# Patient Record
Sex: Female | Born: 1960 | Race: White | Hispanic: No | Marital: Married | Smoking: Never smoker
Health system: Southern US, Community
[De-identification: ages and names within clinical notes are randomized; demographics above are authoritative.]

## PROBLEM LIST (undated history)

## (undated) DIAGNOSIS — K219 Gastro-esophageal reflux disease without esophagitis: Secondary | ICD-10-CM

## (undated) DIAGNOSIS — F32A Depression, unspecified: Secondary | ICD-10-CM

## (undated) DIAGNOSIS — F329 Major depressive disorder, single episode, unspecified: Secondary | ICD-10-CM

## (undated) DIAGNOSIS — J9801 Acute bronchospasm: Secondary | ICD-10-CM

## (undated) DIAGNOSIS — F419 Anxiety disorder, unspecified: Secondary | ICD-10-CM

## (undated) DIAGNOSIS — I499 Cardiac arrhythmia, unspecified: Secondary | ICD-10-CM

## (undated) DIAGNOSIS — R062 Wheezing: Secondary | ICD-10-CM

## (undated) DIAGNOSIS — J189 Pneumonia, unspecified organism: Secondary | ICD-10-CM

## (undated) DIAGNOSIS — T148XXA Other injury of unspecified body region, initial encounter: Secondary | ICD-10-CM

## (undated) DIAGNOSIS — M792 Neuralgia and neuritis, unspecified: Secondary | ICD-10-CM

## (undated) HISTORY — DX: Anxiety disorder, unspecified: F41.9

## (undated) HISTORY — DX: Depression, unspecified: F32.A

## (undated) HISTORY — DX: Neuralgia and neuritis, unspecified: M79.2

## (undated) HISTORY — PX: ABDOMINAL HYSTERECTOMY: SHX81

## (undated) HISTORY — DX: Major depressive disorder, single episode, unspecified: F32.9

## (undated) HISTORY — DX: Acute bronchospasm: J98.01

## (undated) HISTORY — PX: TENDON REPAIR: SHX5111

## (undated) HISTORY — PX: TOTAL ABDOMINAL HYSTERECTOMY: SHX209

---

## 1988-08-23 HISTORY — PX: OTHER SURGICAL HISTORY: SHX169

## 2000-08-23 HISTORY — PX: EXCISIONAL HEMORRHOIDECTOMY: SHX1541

## 2005-08-23 HISTORY — PX: GANGLION CYST EXCISION: SHX1691

## 2013-08-23 HISTORY — PX: INGUINAL HERNIA REPAIR: SUR1180

## 2014-03-13 ENCOUNTER — Ambulatory Visit: Payer: Self-pay

## 2015-01-11 ENCOUNTER — Encounter: Payer: Self-pay | Admitting: Internal Medicine

## 2015-01-11 DIAGNOSIS — J3089 Other allergic rhinitis: Secondary | ICD-10-CM | POA: Insufficient documentation

## 2015-01-11 DIAGNOSIS — M792 Neuralgia and neuritis, unspecified: Secondary | ICD-10-CM | POA: Insufficient documentation

## 2015-01-11 DIAGNOSIS — F324 Major depressive disorder, single episode, in partial remission: Secondary | ICD-10-CM | POA: Insufficient documentation

## 2015-01-11 DIAGNOSIS — N951 Menopausal and female climacteric states: Secondary | ICD-10-CM | POA: Insufficient documentation

## 2015-01-11 HISTORY — DX: Neuralgia and neuritis, unspecified: M79.2

## 2015-05-16 ENCOUNTER — Other Ambulatory Visit: Payer: Self-pay | Admitting: Internal Medicine

## 2015-09-15 ENCOUNTER — Other Ambulatory Visit: Payer: Self-pay | Admitting: Internal Medicine

## 2015-11-04 ENCOUNTER — Ambulatory Visit (INDEPENDENT_AMBULATORY_CARE_PROVIDER_SITE_OTHER): Payer: BLUE CROSS/BLUE SHIELD | Admitting: Internal Medicine

## 2015-11-04 ENCOUNTER — Encounter: Payer: Self-pay | Admitting: Internal Medicine

## 2015-11-04 VITALS — BP 122/82 | HR 87 | Temp 98.4°F | Ht 67.0 in | Wt 202.4 lb

## 2015-11-04 DIAGNOSIS — J0100 Acute maxillary sinusitis, unspecified: Secondary | ICD-10-CM | POA: Diagnosis not present

## 2015-11-04 DIAGNOSIS — F324 Major depressive disorder, single episode, in partial remission: Secondary | ICD-10-CM

## 2015-11-04 MED ORDER — MOMETASONE FUROATE 50 MCG/ACT NA SUSP: 2.0000 | Freq: Every day | NASAL | Status: DC

## 2015-11-04 MED ORDER — LORAZEPAM 0.5 MG PO TABS: 0.5000 mg | ORAL_TABLET | Freq: Every day | ORAL | Status: DC

## 2015-11-04 MED ORDER — TIZANIDINE HCL 4 MG PO TABS: 4.0000 mg | ORAL_TABLET | Freq: Three times a day (TID) | ORAL | Status: DC

## 2015-11-04 MED ORDER — AMOXICILLIN-POT CLAVULANATE 875-125 MG PO TABS: 1.0000 | ORAL_TABLET | Freq: Two times a day (BID) | ORAL | Status: DC

## 2015-11-04 NOTE — Progress Notes (Signed)
Date:  11/04/2015   Name:  Casey Marquez   DOB:  [birth year]   MRN:  [id number]   Chief Complaint: Sinusitis Sinusitis This is a new problem. The current episode started 1 to 4 weeks ago. The problem has been gradually worsening since onset. There has been no fever. Associated symptoms include congestion, ear pain, headaches, sinus pressure and sneezing. Pertinent negatives include no chills, hoarse voice or shortness of breath. Past treatments include saline sprays and spray decongestants.     Review of Systems  Constitutional: Positive for fatigue. Negative for fever and chills.  HENT: Positive for congestion, ear pain, sinus pressure and sneezing. Negative for hoarse voice.   Respiratory: Negative for chest tightness, shortness of breath and wheezing.   Cardiovascular: Negative for chest pain and palpitations.  Gastrointestinal: Negative for vomiting, abdominal pain and diarrhea.  Neurological: Positive for headaches.    Patient Active Problem List   Diagnosis Date Noted  . Allergic state 01/11/2015  . Depression, major, single episode, in partial remission ([place]) 01/11/2015  . Hot flash, menopausal 01/11/2015  . Nerve pain 01/11/2015    Prior to Admission medications   Medication Sig Start Date End Date Taking? Authorizing Provider  FLUoxetine (PROZAC) 10 MG tablet TAKE 1 TABLET BY MOUTH EVERY DAY 09/15/15  Yes Glean Hess, MD  LORazepam (ATIVAN) 0.5 MG tablet Take 1 tablet by mouth at bedtime.   Yes Historical Provider, MD  tiZANidine (ZANAFLEX) 4 MG tablet TAKE 1 TABLET BY MOUTH 3 TIMES A DAY 05/16/15  Yes Glean Hess, MD    Allergies  Allergen Reactions  . Sertraline Nausea Only  . Tegretol  [Carbamazepine]     confusion    Past Surgical History  Procedure Laterality Date  . Inguinal hernia repair Left 2015  . Fibroidectomy  1990  . Ganglion cyst excision  2007  . Excisional hemorrhoidectomy  2002    Social History  Substance Use Topics  . Smoking  status: Never Smoker   . Smokeless tobacco: None  . Alcohol Use: 0.0 oz/week    0 Standard drinks or equivalent per week     Comment: occasionally    Medication list has been reviewed and updated.   Physical Exam  Constitutional: She is oriented to person, place, and time. She appears well-developed and well-nourished.  HENT:  Right Ear: External ear and ear canal normal. Tympanic membrane is retracted. Tympanic membrane is not erythematous.  Left Ear: External ear and ear canal normal. Tympanic membrane is not erythematous and not retracted.  Nose: Right sinus exhibits maxillary sinus tenderness and frontal sinus tenderness. Left sinus exhibits maxillary sinus tenderness and frontal sinus tenderness.  Mouth/Throat: Uvula is midline and mucous membranes are normal. No oral lesions. Posterior oropharyngeal erythema present. No oropharyngeal exudate.  Cardiovascular: Normal rate, regular rhythm and normal heart sounds.   Pulmonary/Chest: Breath sounds normal. She has no wheezes. She has no rales.  Musculoskeletal: She exhibits no edema or tenderness.  Lymphadenopathy:    She has no cervical adenopathy.  Neurological: She is alert and oriented to person, place, and time.    BP 122/82 mmHg  Pulse 87  Temp(Src) 98.4 F (36.9 C) (Oral)  Ht 5\' 7"  (1.702 m)  Wt 202 lb 6.4 oz (91.808 kg)  BMI 31.69 kg/m2  SpO2 97%  Assessment and Plan: 1. Acute maxillary sinusitis, recurrence not specified Hold zyrtec; continue steroid nasal spray - amoxicillin-clavulanate (AUGMENTIN) 875-125 MG tablet; Take 1 tablet by mouth 2 (two) times  daily.  Dispense: 20 tablet; Refill: 0 - mometasone (NASONEX) 50 MCG/ACT nasal spray; Place 2 sprays into the nose daily.  Dispense: 17 g; Refill: 12  2. Depression, major, single episode, in partial remission (HCC) - LORazepam (ATIVAN) 0.5 MG tablet; Take 1 tablet (0.5 mg total) by mouth at bedtime.  Dispense: 30 tablet; Refill: 0   Halina Maidens, MD [facility]  11/04/2015

## 2016-04-03 ENCOUNTER — Other Ambulatory Visit: Payer: Self-pay | Admitting: Internal Medicine

## 2016-05-24 ENCOUNTER — Other Ambulatory Visit: Payer: Self-pay | Admitting: Internal Medicine

## 2016-05-24 ENCOUNTER — Telehealth: Payer: Self-pay | Admitting: Internal Medicine

## 2016-05-24 MED ORDER — FLUOXETINE HCL 10 MG PO TABS: 10.0000 mg | ORAL_TABLET | Freq: Every day | ORAL | 5 refills | Status: DC

## 2016-05-24 NOTE — Telephone Encounter (Signed)
I sent in the Rx as requested.  She needs to schedule a CPX and labs.

## 2016-05-24 NOTE — Telephone Encounter (Signed)
Patient would like a refill on her Prozac 10 mg. Patient is out of medication and would like for it to be called into [facility] in [place]. 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

## 2016-06-24 NOTE — Telephone Encounter (Signed)
Patient scheduled cpe on 11/11/2016 @ 1030

## 2016-08-23 DIAGNOSIS — J189 Pneumonia, unspecified organism: Secondary | ICD-10-CM

## 2016-08-23 HISTORY — DX: Pneumonia, unspecified organism: J18.9

## 2016-11-11 ENCOUNTER — Encounter: Payer: Self-pay | Admitting: Internal Medicine

## 2016-11-11 ENCOUNTER — Ambulatory Visit (INDEPENDENT_AMBULATORY_CARE_PROVIDER_SITE_OTHER): Payer: 59 | Admitting: Internal Medicine

## 2016-11-11 VITALS — BP 122/84 | HR 84 | Ht 67.0 in

## 2016-11-11 DIAGNOSIS — Z0000 Encounter for general adult medical examination without abnormal findings: Secondary | ICD-10-CM

## 2016-11-11 DIAGNOSIS — K219 Gastro-esophageal reflux disease without esophagitis: Secondary | ICD-10-CM

## 2016-11-11 DIAGNOSIS — Z1159 Encounter for screening for other viral diseases: Secondary | ICD-10-CM | POA: Diagnosis not present

## 2016-11-11 DIAGNOSIS — S92902A Unspecified fracture of left foot, initial encounter for closed fracture: Secondary | ICD-10-CM

## 2016-11-11 DIAGNOSIS — Z1239 Encounter for other screening for malignant neoplasm of breast: Secondary | ICD-10-CM

## 2016-11-11 DIAGNOSIS — Z1231 Encounter for screening mammogram for malignant neoplasm of breast: Secondary | ICD-10-CM

## 2016-11-11 DIAGNOSIS — F324 Major depressive disorder, single episode, in partial remission: Secondary | ICD-10-CM | POA: Diagnosis not present

## 2016-11-11 LAB — POCT URINALYSIS DIPSTICK
Bilirubin, UA: NEGATIVE
Glucose, UA: NEGATIVE
Ketones, UA: NEGATIVE
LEUKOCYTES UA: NEGATIVE
NITRITE UA: NEGATIVE
PH UA: 6 (ref 5.0–8.0)
PROTEIN UA: NEGATIVE
RBC UA: NEGATIVE
Spec Grav, UA: 1.03 (ref 1.030–1.035)
Urobilinogen, UA: 0.2 (ref ?–2.0)

## 2016-11-11 MED ORDER — LORAZEPAM 0.5 MG PO TABS: 0.5000 mg | ORAL_TABLET | Freq: Every day | ORAL | 0 refills | Status: DC

## 2016-11-11 MED ORDER — TIZANIDINE HCL 4 MG PO TABS: 4.0000 mg | ORAL_TABLET | Freq: Three times a day (TID) | ORAL | 0 refills | Status: DC

## 2016-11-11 MED ORDER — FLUOXETINE HCL 10 MG PO TABS: 10.0000 mg | ORAL_TABLET | Freq: Every day | ORAL | 5 refills | Status: DC

## 2016-11-11 NOTE — Progress Notes (Signed)
Date:  11/11/2016   Name:  Casey Marquez   DOB:  Feb 20, 1961   MRN:  [id number]   Chief Complaint: Annual Exam (Pap needed. ) Casey Marquez is a 56 y.o. female who presents today for her Complete Annual Exam. She feels fairly well. She reports exercising some. She reports she is sleeping well. She is due for a mammogram and Pap.  She has also had a third foot fracture and has never had a DEXA.  Abdominal Pain  This is a new problem. The current episode started more than 1 month ago. The problem occurs intermittently. The problem has been unchanged. The pain is located in the epigastric region. The quality of the pain is burning. The abdominal pain radiates to the epigastric region. Associated symptoms include belching. Pertinent negatives include no arthralgias, constipation, diarrhea, dysuria, fever, frequency, headaches, hematochezia, vomiting or weight loss. The pain is aggravated by eating. She has tried nothing for the symptoms.     Review of Systems  Constitutional: Negative for chills, fatigue, fever and weight loss.  HENT: Negative for congestion, hearing loss, tinnitus, trouble swallowing and voice change.   Eyes: Negative for visual disturbance.  Respiratory: Negative for cough, chest tightness, shortness of breath and wheezing.   Cardiovascular: Negative for chest pain, palpitations and leg swelling.  Gastrointestinal: Negative for abdominal pain, constipation, diarrhea, hematochezia and vomiting.  Endocrine: Negative for polydipsia and polyuria.  Genitourinary: Negative for dysuria, frequency, genital sores, vaginal bleeding and vaginal discharge.  Musculoskeletal: Negative for arthralgias, gait problem and joint swelling.  Skin: Negative for color change and rash.  Neurological: Negative for dizziness, tremors, light-headedness and headaches.  Hematological: Negative for adenopathy. Does not bruise/bleed easily.  Psychiatric/Behavioral: Negative for dysphoric mood and sleep  disturbance. The patient is not nervous/anxious.     Patient Active Problem List   Diagnosis Date Noted  . Allergic state 01/11/2015  . Depression, major, single episode, in partial remission ([place]) 01/11/2015  . Hot flash, menopausal 01/11/2015  . Nerve pain 01/11/2015    Prior to Admission medications   Medication Sig Start Date End Date Taking? Authorizing Provider  FLUoxetine (PROZAC) 10 MG tablet Take 1 tablet (10 mg total) by mouth daily. 05/24/16  Yes Glean Hess, MD  LORazepam (ATIVAN) 0.5 MG tablet Take 1 tablet (0.5 mg total) by mouth at bedtime. 11/04/15  Yes Glean Hess, MD  mometasone (NASONEX) 50 MCG/ACT nasal spray Place 2 sprays into the nose daily. 11/04/15  Yes Glean Hess, MD  tiZANidine (ZANAFLEX) 4 MG tablet Take 1 tablet (4 mg total) by mouth 3 (three) times daily. 11/04/15  Yes Glean Hess, MD    Allergies  Allergen Reactions  . Sertraline Nausea Only  . Tegretol  [Carbamazepine]     confusion    Past Surgical History:  Procedure Laterality Date  . EXCISIONAL HEMORRHOIDECTOMY  2002  . fibroidectomy  1990  . GANGLION CYST EXCISION  2007  . INGUINAL HERNIA REPAIR Left 2015    Social History  Substance Use Topics  . Smoking status: Never Smoker  . Smokeless tobacco: Never Used  . Alcohol use 0.0 oz/week     Comment: occasionally     Medication list has been reviewed and updated.   Physical Exam  Constitutional: She is oriented to person, place, and time. She appears well-developed and well-nourished. No distress.  HENT:  Head: Normocephalic and atraumatic.  Right Ear: Tympanic membrane and ear canal normal.  Left Ear: Tympanic membrane and  ear canal normal.  Nose: Right sinus exhibits no maxillary sinus tenderness. Left sinus exhibits no maxillary sinus tenderness.  Mouth/Throat: Uvula is midline and oropharynx is clear and moist.  Eyes: Conjunctivae and EOM are normal. Right eye exhibits no discharge. Left eye exhibits no  discharge. No scleral icterus.  Neck: Normal range of motion. Carotid bruit is not present. No erythema present. No thyromegaly present.  Cardiovascular: Normal rate, regular rhythm, normal heart sounds and normal pulses.   Pulmonary/Chest: Effort normal. No respiratory distress. She has no wheezes. Right breast exhibits no mass, no nipple discharge, no skin change and no tenderness. Left breast exhibits no mass, no nipple discharge, no skin change and no tenderness.  Abdominal: Soft. Bowel sounds are normal. There is no hepatosplenomegaly. There is tenderness in the epigastric area. There is no rigidity, no guarding and no CVA tenderness.  Genitourinary: Vagina normal and uterus normal. There is no tenderness, lesion or injury on the right labia. There is no tenderness, lesion or injury on the left labia. Cervix exhibits no motion tenderness, no discharge and no friability. Right adnexum displays no mass, no tenderness and no fullness. Left adnexum displays no mass, no tenderness and no fullness.  Musculoskeletal: Normal range of motion. She exhibits no edema or tenderness.       Right ankle: Normal.       Left ankle: Normal.  Lymphadenopathy:    She has no cervical adenopathy.    She has no axillary adenopathy.  Neurological: She is alert and oriented to person, place, and time. She has normal reflexes. No cranial nerve deficit or sensory deficit.  Skin: Skin is warm, dry and intact. No rash noted.  Scattered small excoriations over face and arms  Psychiatric: She has a normal mood and affect. Her speech is normal and behavior is normal. Thought content normal.  Nursing note and vitals reviewed.   BP 122/84 (BP Location: Right Arm, Patient Position: Sitting, Cuff Size: Normal)   Pulse 84   Ht 5\' 7"  (1.702 m)   SpO2 99%   Assessment and Plan: 1. Annual physical exam - Pap IG and HPV (high risk) DNA detection - Comprehensive metabolic panel - CBC with Differential/Platelet - Lipid  panel - TSH - POCT urinalysis dipstick  2. Breast cancer screening - MM DIGITAL SCREENING BILATERAL; Future  3. Depression, major, single episode, in partial remission (HCC) Continue Prozac and PRN ativan - LORazepam (ATIVAN) 0.5 MG tablet; Take 1 tablet (0.5 mg total) by mouth at bedtime.  Dispense: 30 tablet; Refill: 0  4. Need for hepatitis C screening test - Hepatitis C antibody  5. Closed fracture of left foot, initial encounter Follow up with Podiatry - DG Bone Density; Future  6. GERD without esophagitis Try 2 weeks of otc Pepcid or Zantac Return if sx worsen   Meds ordered this encounter  Medications  . FLUoxetine (PROZAC) 10 MG tablet    Sig: Take 1 tablet (10 mg total) by mouth daily.    Dispense:  30 tablet    Refill:  5  . LORazepam (ATIVAN) 0.5 MG tablet    Sig: Take 1 tablet (0.5 mg total) by mouth at bedtime.    Dispense:  30 tablet    Refill:  0  . tiZANidine (ZANAFLEX) 4 MG tablet    Sig: Take 1 tablet (4 mg total) by mouth 3 (three) times daily.    Dispense:  30 tablet    Refill:  0    Halina Maidens, MD  [facility]  11/11/2016

## 2016-11-11 NOTE — Patient Instructions (Addendum)
Breast Self-Awareness Breast self-awareness means being familiar with how your breasts look and feel. It involves checking your breasts regularly and reporting any changes to your health care provider. Practicing breast self-awareness is important. A change in your breasts can be a sign of a serious medical problem. Being familiar with how your breasts look and feel allows you to find any problems early, when treatment is more likely to be successful. All women should practice breast self-awareness, including women who have had breast implants. How to do a breast self-exam One way to learn what is normal for your breasts and whether your breasts are changing is to do a breast self-exam. To do a breast self-exam: Look for Changes   1. Remove all the clothing above your waist. 2. Stand in front of a mirror in a room with good lighting. 3. Put your hands on your hips. 4. Push your hands firmly downward. 5. Compare your breasts in the mirror. Look for differences between them (asymmetry), such as:  Differences in shape.  Differences in size.  Puckers, dips, and bumps in one breast and not the other. 6. Look at each breast for changes in your skin, such as:  Redness.  Scaly areas. 7. Look for changes in your nipples, such as:  Discharge.  Bleeding.  Dimpling.  Redness.  A change in position. Feel for Changes   Carefully feel your breasts for lumps and changes. It is best to do this while lying on your back on the floor and again while sitting or standing in the shower or tub with soapy water on your skin. Feel each breast in the following way:  Place the arm on the side of the breast you are examining above your head.  Feel your breast with the other hand.  Start in the nipple area and make  inch (2 cm) overlapping circles to feel your breast. Use the pads of your three middle fingers to do this. Apply light pressure, then medium pressure, then firm pressure. The light pressure  will allow you to feel the tissue closest to the skin. The medium pressure will allow you to feel the tissue that is a little deeper. The firm pressure will allow you to feel the tissue close to the ribs.  Continue the overlapping circles, moving downward over the breast until you feel your ribs below your breast.  Move one finger-width toward the center of the body. Continue to use the  inch (2 cm) overlapping circles to feel your breast as you move slowly up toward your collarbone.  Continue the up and down exam using all three pressures until you reach your armpit. Write Down What You Find   Write down what is normal for each breast and any changes that you find. Keep a written record with breast changes or normal findings for each breast. By writing this information down, you do not need to depend only on memory for size, tenderness, or location. Write down where you are in your menstrual cycle, if you are still menstruating. If you are having trouble noticing differences in your breasts, do not get discouraged. With time you will become more familiar with the variations in your breasts and more comfortable with the exam. How often should I examine my breasts? Examine your breasts every month. If you are breastfeeding, the best time to examine your breasts is after a feeding or after using a breast pump. If you menstruate, the best time to examine your breasts is 5-7 days  after your period is over. During your period, your breasts are lumpier, and it may be more difficult to notice changes. When should I see my health care provider? See your health care provider if you notice:  A change in shape or size of your breasts or nipples.  A change in the skin of your breast or nipples, such as a reddened or scaly area.  Unusual discharge from your nipples.  A lump or thick area that was not there before.  Pain in your breasts.  Anything that concerns you. This information is not intended to  replace advice given to you by your health care provider. Make sure you discuss any questions you have with your health care provider. Document Released: 08/09/2005 Document Revised: 01/15/2016 Document Reviewed: 06/29/2015 Elsevier Interactive Patient Education  2017 [employer].  For Osteoporosis:  Fosamax, Boniva, Actonel  Prolia  Reclast

## 2016-11-12 LAB — LIPID PANEL
CHOL/HDL RATIO: 4.3 ratio (ref 0.0–4.4)
Cholesterol, Total: 234 mg/dL — ABNORMAL HIGH (ref 100–199)
HDL: 55 mg/dL (ref >39)
LDL Calculated: 156 mg/dL — ABNORMAL HIGH (ref 0–99)
TRIGLYCERIDES: 115 mg/dL (ref 0–149)
VLDL Cholesterol Cal: 23 mg/dL (ref 5–40)

## 2016-11-12 LAB — COMPREHENSIVE METABOLIC PANEL
A/G RATIO: 1.7 (ref 1.2–2.2)
ALBUMIN: 4.6 g/dL (ref 3.5–5.5)
ALK PHOS: 76 IU/L (ref 39–117)
ALT: 8 IU/L (ref 0–32)
AST: 20 IU/L (ref 0–40)
BILIRUBIN TOTAL: 0.4 mg/dL (ref 0.0–1.2)
BUN / CREAT RATIO: 36 — AB (ref 9–23)
BUN: 26 mg/dL — ABNORMAL HIGH (ref 6–24)
CHLORIDE: 101 mmol/L (ref 96–106)
CO2: 26 mmol/L (ref 18–29)
CREATININE: 0.73 mg/dL (ref 0.57–1.00)
Calcium: 9.7 mg/dL (ref 8.7–10.2)
GFR calc Af Amer: 108 mL/min/{1.73_m2} (ref >59)
GFR calc non Af Amer: 94 mL/min/{1.73_m2} (ref >59)
GLOBULIN, TOTAL: 2.7 g/dL (ref 1.5–4.5)
Glucose: 99 mg/dL (ref 65–99)
POTASSIUM: 4.3 mmol/L (ref 3.5–5.2)
SODIUM: 142 mmol/L (ref 134–144)
Total Protein: 7.3 g/dL (ref 6.0–8.5)

## 2016-11-12 LAB — CBC WITH DIFFERENTIAL/PLATELET
BASOS ABS: 0.1 10*3/uL (ref 0.0–0.2)
Basos: 1 %
EOS (ABSOLUTE): 0.1 10*3/uL (ref 0.0–0.4)
Eos: 2 %
Hematocrit: 44.7 % (ref 34.0–46.6)
Hemoglobin: 14.1 g/dL (ref 11.1–15.9)
IMMATURE GRANULOCYTES: 0 %
Immature Grans (Abs): 0 10*3/uL (ref 0.0–0.1)
LYMPHS ABS: 2.9 10*3/uL (ref 0.7–3.1)
Lymphs: 41 %
MCH: 28.7 pg (ref 26.6–33.0)
MCHC: 31.5 g/dL (ref 31.5–35.7)
MCV: 91 fL (ref 79–97)
MONOS ABS: 0.5 10*3/uL (ref 0.1–0.9)
Monocytes: 7 %
NEUTROS PCT: 49 %
Neutrophils Absolute: 3.5 10*3/uL (ref 1.4–7.0)
PLATELETS: 315 10*3/uL (ref 150–379)
RBC: 4.91 x10E6/uL (ref 3.77–5.28)
RDW: 13.7 % (ref 12.3–15.4)
WBC: 7 10*3/uL (ref 3.4–10.8)

## 2016-11-12 LAB — HEPATITIS C ANTIBODY: Hep C Virus Ab: <0.1 s/co ratio (ref 0.0–0.9)

## 2016-11-12 LAB — TSH: TSH: 4.22 u[IU]/mL (ref 0.450–4.500)

## 2016-11-13 LAB — PAP IG AND HPV HIGH-RISK
HPV, HIGH-RISK: NEGATIVE
PAP Smear Comment: 0

## 2016-12-13 ENCOUNTER — Ambulatory Visit
Admission: RE | Admit: 2016-12-13 | Discharge: 2016-12-13 | Disposition: A | Discharge status: Home / Self Care | Payer: 59 | Source: Ambulatory Visit | Attending: Internal Medicine | Admitting: Internal Medicine

## 2016-12-13 DIAGNOSIS — E2839 Other primary ovarian failure: Secondary | ICD-10-CM | POA: Insufficient documentation

## 2016-12-13 DIAGNOSIS — Z1231 Encounter for screening mammogram for malignant neoplasm of breast: Secondary | ICD-10-CM | POA: Insufficient documentation

## 2016-12-13 DIAGNOSIS — Z1239 Encounter for other screening for malignant neoplasm of breast: Secondary | ICD-10-CM

## 2016-12-13 DIAGNOSIS — S92902A Unspecified fracture of left foot, initial encounter for closed fracture: Secondary | ICD-10-CM

## 2017-02-04 ENCOUNTER — Encounter: Payer: Self-pay | Admitting: Internal Medicine

## 2017-02-04 ENCOUNTER — Ambulatory Visit (INDEPENDENT_AMBULATORY_CARE_PROVIDER_SITE_OTHER): Payer: 59 | Admitting: Internal Medicine

## 2017-02-04 VITALS — BP 108/80 | HR 95 | Ht 67.0 in | Wt 197.8 lb

## 2017-02-04 DIAGNOSIS — R300 Dysuria: Secondary | ICD-10-CM | POA: Diagnosis not present

## 2017-02-04 LAB — POC URINALYSIS WITH MICROSCOPIC (NON AUTO)MANUAL RESULT
BILIRUBIN UA: NEGATIVE
Crystals: 0
Epithelial cells, urine per micros: 0
GLUCOSE UA: NEGATIVE
KETONES UA: NEGATIVE
Leukocytes, UA: NEGATIVE
Mucus, UA: 0
NITRITE UA: POSITIVE
PH UA: 6 (ref 5.0–8.0)
Protein, UA: NEGATIVE
RBC UA: NEGATIVE
RBC: 0 M/uL — AB (ref 4.04–5.48)
Spec Grav, UA: 1.02 (ref 1.010–1.025)
UROBILINOGEN UA: 0.2 U/dL
WBC CASTS UA: 3

## 2017-02-04 MED ORDER — NITROFURANTOIN MONOHYD MACRO 100 MG PO CAPS: 100.0000 mg | ORAL_CAPSULE | Freq: Two times a day (BID) | ORAL | 0 refills | Status: DC

## 2017-02-04 NOTE — Progress Notes (Signed)
    Date:  02/04/2017   Name:  Casey Marquez   DOB:  05-30-1961   MRN:  [id number]   Chief Complaint: Urinary Tract Infection (Tried AZO. Discomfort when urinating. "feeling before urine comes out." Feels like have to go all the time but nothings there. )    Review of Systems  Constitutional: Negative for chills, fatigue and fever.  Respiratory: Negative for chest tightness.   Genitourinary: Positive for frequency and urgency. Negative for dysuria and hematuria.    Patient Active Problem List   Diagnosis Date Noted  . Allergic state 01/11/2015  . Depression, major, single episode, in partial remission ([place]) 01/11/2015  . Hot flash, menopausal 01/11/2015  . Nerve pain 01/11/2015    Prior to Admission medications   Medication Sig Start Date End Date Taking? Authorizing Provider  FLUoxetine (PROZAC) 10 MG tablet Take 1 tablet (10 mg total) by mouth daily. 11/11/16  Yes Glean Hess, MD  LORazepam (ATIVAN) 0.5 MG tablet Take 1 tablet (0.5 mg total) by mouth at bedtime. 11/11/16  Yes Glean Hess, MD  mometasone (NASONEX) 50 MCG/ACT nasal spray Place 2 sprays into the nose daily. 11/04/15  Yes Glean Hess, MD  tiZANidine (ZANAFLEX) 4 MG tablet Take 1 tablet (4 mg total) by mouth 3 (three) times daily. 11/11/16  Yes Glean Hess, MD    Allergies  Allergen Reactions  . Sertraline Nausea Only  . Tegretol  [Carbamazepine]     confusion    Past Surgical History:  Procedure Laterality Date  . EXCISIONAL HEMORRHOIDECTOMY  2002  . fibroidectomy  1990  . GANGLION CYST EXCISION  2007  . INGUINAL HERNIA REPAIR Left 2015    Social History  Substance Use Topics  . Smoking status: Never Smoker  . Smokeless tobacco: Never Used  . Alcohol use 0.0 oz/week     Comment: occasionally     Medication list has been reviewed and updated.   Physical Exam  Constitutional: She appears well-developed and well-nourished.  Cardiovascular: Normal rate, regular rhythm and  normal heart sounds.   Pulmonary/Chest: Effort normal and breath sounds normal. No respiratory distress.  Abdominal: Soft. Bowel sounds are normal. There is no tenderness. There is no rebound, no guarding and no CVA tenderness.  Psychiatric: She has a normal mood and affect.  Nursing note and vitals reviewed.   BP 108/80   Pulse 95   Ht 5\' 7"  (1.702 m)   Wt 197 lb 12.8 oz (89.7 kg)   SpO2 98%   BMI 30.98 kg/m   Assessment and Plan:

## 2017-02-04 NOTE — Patient Instructions (Signed)

## 2017-02-16 ENCOUNTER — Telehealth: Payer: Self-pay | Admitting: Internal Medicine

## 2017-02-16 ENCOUNTER — Telehealth: Payer: Self-pay

## 2017-02-16 ENCOUNTER — Other Ambulatory Visit: Payer: Self-pay | Admitting: Internal Medicine

## 2017-02-16 MED ORDER — CIPROFLOXACIN HCL 250 MG PO TABS: 250.0000 mg | ORAL_TABLET | Freq: Two times a day (BID) | ORAL | 0 refills | Status: AC

## 2017-02-16 NOTE — Telephone Encounter (Signed)
ERROR

## 2017-02-16 NOTE — Telephone Encounter (Signed)
Rx for Cipro sent.

## 2017-02-16 NOTE — Telephone Encounter (Signed)
Patient called and said that the nitrofurantoin, macrocrystal-monohydrate, (MACROBID) 100 MG capsule did not cure her symptoms and would like to be prescribe another med-please call patient.

## 2017-02-16 NOTE — Telephone Encounter (Signed)
Patient requesting different medication. Need different appt? Advice please.

## 2017-03-14 ENCOUNTER — Ambulatory Visit: Payer: 59 | Admitting: Internal Medicine

## 2017-03-15 ENCOUNTER — Encounter: Payer: Self-pay | Admitting: Internal Medicine

## 2017-03-15 ENCOUNTER — Ambulatory Visit (INDEPENDENT_AMBULATORY_CARE_PROVIDER_SITE_OTHER): Payer: 59 | Admitting: Internal Medicine

## 2017-03-15 VITALS — BP 104/66 | HR 80 | Ht 67.0 in | Wt 195.0 lb

## 2017-03-15 DIAGNOSIS — R300 Dysuria: Secondary | ICD-10-CM | POA: Diagnosis not present

## 2017-03-15 DIAGNOSIS — N952 Postmenopausal atrophic vaginitis: Secondary | ICD-10-CM | POA: Diagnosis not present

## 2017-03-15 LAB — POCT URINALYSIS DIPSTICK
Bilirubin, UA: NEGATIVE
GLUCOSE UA: NEGATIVE
Ketones, UA: NEGATIVE
LEUKOCYTES UA: NEGATIVE
NITRITE UA: NEGATIVE
PH UA: 5 (ref 5.0–8.0)
Protein, UA: NEGATIVE
RBC UA: NEGATIVE
Spec Grav, UA: 1.025 (ref 1.010–1.025)
UROBILINOGEN UA: 0.2 U/dL

## 2017-03-15 NOTE — Patient Instructions (Signed)
Begin a fingertip amount of estrogen cream to urethra nightly

## 2017-03-15 NOTE — Progress Notes (Signed)
    Date:  03/15/2017   Name:  Casey Marquez   DOB:  09/29/1960   MRN:  [id number]   Chief Complaint: Urinary Tract Infection (Gone through 3 courses of antibiotics. Pressure, irritation. Constantly feeling uncomfortable. Taking AZO still. ) Urinary Tract Infection   This is a recurrent problem. The problem occurs every urination. She is not sexually active. Pertinent negatives include no frequency, hematuria, nausea or vomiting. She has tried antibiotics for the symptoms. The treatment provided no relief.      Review of Systems  Gastrointestinal: Negative for abdominal pain, nausea and vomiting.  Genitourinary: Positive for dysuria. Negative for frequency, hematuria, vaginal bleeding and vaginal pain.    Patient Active Problem List   Diagnosis Date Noted  . Allergic state 01/11/2015  . Depression, major, single episode, in partial remission ([place]) 01/11/2015  . Hot flash, menopausal 01/11/2015  . Nerve pain 01/11/2015    Prior to Admission medications   Medication Sig Start Date End Date Taking? Authorizing Provider  FLUoxetine (PROZAC) 10 MG tablet Take 1 tablet (10 mg total) by mouth daily. 11/11/16   Glean Hess, MD  LORazepam (ATIVAN) 0.5 MG tablet Take 1 tablet (0.5 mg total) by mouth at bedtime. 11/11/16   Glean Hess, MD  mometasone (NASONEX) 50 MCG/ACT nasal spray Place 2 sprays into the nose daily. 11/04/15   Glean Hess, MD  tiZANidine (ZANAFLEX) 4 MG tablet Take 1 tablet (4 mg total) by mouth 3 (three) times daily. 11/11/16   Glean Hess, MD    Allergies  Allergen Reactions  . Sertraline Nausea Only  . Tegretol  [Carbamazepine]     confusion    Past Surgical History:  Procedure Laterality Date  . EXCISIONAL HEMORRHOIDECTOMY  2002  . fibroidectomy  1990  . GANGLION CYST EXCISION  2007  . INGUINAL HERNIA REPAIR Left 2015    Social History  Substance Use Topics  . Smoking status: Never Smoker  . Smokeless tobacco: Never Used  . Alcohol  use 0.0 oz/week     Comment: occasionally     Medication list has been reviewed and updated.   Physical Exam  Constitutional: She is oriented to person, place, and time. She appears well-developed. No distress.  HENT:  Head: Normocephalic and atraumatic.  Cardiovascular: Normal rate, regular rhythm and normal heart sounds.   Pulmonary/Chest: Effort normal and breath sounds normal. No respiratory distress.  Abdominal: Soft. Bowel sounds are normal. There is no tenderness.  Musculoskeletal: Normal range of motion.  Neurological: She is alert and oriented to person, place, and time.  Skin: Skin is warm and dry. No rash noted.  Psychiatric: She has a normal mood and affect. Her behavior is normal. Thought content normal.  Nursing note and vitals reviewed.   BP 104/66   Pulse 80   Ht 5\' 7"  (1.702 m)   Wt 195 lb (88.5 kg)   SpO2 97%   BMI 30.54 kg/m   Assessment and Plan: 1. Atrophic vaginitis Suspect this is the cause of sx Begin fingertip amount of estrogen cream to urethra nightly  2. Dysuria UA negative - POCT urinalysis dipstick   No orders of the defined types were placed in this encounter.   Halina Maidens, MD [facility]  03/15/2017

## 2017-05-13 ENCOUNTER — Encounter: Payer: Self-pay | Admitting: Internal Medicine

## 2017-05-13 ENCOUNTER — Ambulatory Visit (INDEPENDENT_AMBULATORY_CARE_PROVIDER_SITE_OTHER): Payer: 59 | Admitting: Internal Medicine

## 2017-05-13 VITALS — BP 110/86 | HR 77 | Temp 98.8°F | Ht 67.0 in | Wt 190.0 lb

## 2017-05-13 DIAGNOSIS — J40 Bronchitis, not specified as acute or chronic: Secondary | ICD-10-CM

## 2017-05-13 MED ORDER — LEVOFLOXACIN 500 MG PO TABS: 500.0000 mg | ORAL_TABLET | Freq: Every day | ORAL | 0 refills | Status: DC

## 2017-05-13 NOTE — Progress Notes (Signed)
Date:  05/13/2017   Name:  Casey Marquez   DOB:  [birth year]   MRN:  [id number]   Chief Complaint: Cough (X 2 weeks- Thought was a cold but not going away. Feels lightheaded and exausted. No fever. and clear mucous. Feels very exausted. Body aches, and some on and off headaches. ) Cough  This is a new problem. The current episode started 1 to 4 weeks ago. The problem has been gradually worsening. The problem occurs every few minutes. The cough is non-productive. Associated symptoms include chills, myalgias, nasal congestion and shortness of breath. Pertinent negatives include no fever, headaches or sweats. The symptoms are aggravated by exercise. She has tried OTC cough suppressant for the symptoms. The treatment provided mild relief.      Review of Systems  Constitutional: Positive for chills. Negative for fever.  Respiratory: Positive for cough and shortness of breath.   Musculoskeletal: Positive for myalgias.  Neurological: Negative for headaches.    Patient Active Problem List   Diagnosis Date Noted  . Allergic state 01/11/2015  . Depression, major, single episode, in partial remission ([place]) 01/11/2015  . Hot flash, menopausal 01/11/2015  . Nerve pain 01/11/2015    Prior to Admission medications   Medication Sig Start Date End Date Taking? Authorizing Provider  FLUoxetine (PROZAC) 10 MG tablet Take 1 tablet (10 mg total) by mouth daily. 11/11/16  Yes Glean Hess, MD  LORazepam (ATIVAN) 0.5 MG tablet Take 1 tablet (0.5 mg total) by mouth at bedtime. 11/11/16  Yes Glean Hess, MD  mometasone (NASONEX) 50 MCG/ACT nasal spray Place 2 sprays into the nose daily. 11/04/15  Yes Glean Hess, MD  tiZANidine (ZANAFLEX) 4 MG tablet Take 1 tablet (4 mg total) by mouth 3 (three) times daily. 11/11/16  Yes Glean Hess, MD    Allergies  Allergen Reactions  . Sertraline Nausea Only  . Tegretol  [Carbamazepine]     confusion    Past Surgical History:  Procedure  Laterality Date  . EXCISIONAL HEMORRHOIDECTOMY  2002  . fibroidectomy  1990  . GANGLION CYST EXCISION  2007  . INGUINAL HERNIA REPAIR Left 2015    Social History  Substance Use Topics  . Smoking status: Never Smoker  . Smokeless tobacco: Never Used  . Alcohol use 0.0 oz/week     Comment: occasionally     Medication list has been reviewed and updated.  PHQ 2/9 Scores 05/13/2017  PHQ - 2 Score 2  PHQ- 9 Score 5  Exception Documentation Other- indicate reason in comment box  Not completed stir crazy but nothing out of the ordinary - per patient    Physical Exam  Constitutional: She is oriented to person, place, and time. She appears well-developed. No distress.  HENT:  Head: Normocephalic and atraumatic.  Right Ear: Tympanic membrane and ear canal normal.  Left Ear: Tympanic membrane and ear canal normal.  Cardiovascular: Normal rate, regular rhythm and normal heart sounds.   Pulmonary/Chest: Effort normal. No respiratory distress. She has decreased breath sounds. She has no wheezes.  Musculoskeletal: Normal range of motion.  Neurological: She is alert and oriented to person, place, and time.  Skin: Skin is warm and dry. No rash noted.  Psychiatric: She has a normal mood and affect. Her behavior is normal. Thought content normal.  Nursing note and vitals reviewed.   BP 110/86   Pulse 77   Temp 98.8 F (37.1 C) (Oral)   Ht 5\' 7"  (1.702 m)  Wt 190 lb (86.2 kg)   SpO2 98%   BMI 29.76 kg/m   Assessment and Plan: 1. Bronchitis Fluids and rest otc cough meds if needed - levofloxacin (LEVAQUIN) 500 MG tablet; Take 1 tablet (500 mg total) by mouth daily.  Dispense: 7 tablet; Refill: 0   Meds ordered this encounter  Medications  . levofloxacin (LEVAQUIN) 500 MG tablet    Sig: Take 1 tablet (500 mg total) by mouth daily.    Dispense:  7 tablet    Refill:  0    Partially dictated using Editor, commissioning. Any errors are unintentional.  Halina Maidens, MD [facility]  05/13/2017

## 2017-05-23 ENCOUNTER — Encounter: Payer: Self-pay | Admitting: *Deleted

## 2017-05-23 ENCOUNTER — Ambulatory Visit
Admission: EM | Admit: 2017-05-23 | Discharge: 2017-05-23 | Disposition: A | Discharge status: Home / Self Care | Payer: 59 | Attending: Family Medicine | Admitting: Family Medicine

## 2017-05-23 DIAGNOSIS — R0602 Shortness of breath: Secondary | ICD-10-CM | POA: Diagnosis not present

## 2017-05-23 DIAGNOSIS — J9801 Acute bronchospasm: Secondary | ICD-10-CM

## 2017-05-23 DIAGNOSIS — R05 Cough: Secondary | ICD-10-CM | POA: Diagnosis not present

## 2017-05-23 DIAGNOSIS — R059 Cough, unspecified: Secondary | ICD-10-CM

## 2017-05-23 MED ORDER — ALBUTEROL SULFATE HFA 108 (90 BASE) MCG/ACT IN AERS: INHALATION_SPRAY | RESPIRATORY_TRACT | 0 refills | Status: DC

## 2017-05-23 MED ORDER — PREDNISONE 20 MG PO TABS
20.0000 mg | ORAL_TABLET | Freq: Every day | ORAL | 0 refills | Status: DC
Start: 2017-05-23 — End: 2017-06-28

## 2017-05-23 NOTE — ED Provider Notes (Signed)
[facility]    CSN: [id number] Arrival date & time: 05/23/17  [place]     History   Chief Complaint Chief Complaint  Patient presents with  . Shortness of Breath    HPI Casey Marquez is a 56 y.o. female.   56 yo female with a cough, fatigue, intermittent wheezing and shortness of breath for 2 weeks. States was recently treated last week for bronchitis and finished a 7 day course of Levaquin but states symptoms have not resolved. Denies any fevers, chills, sputum production. Has been having post nasal drainage as well.     Shortness of Breath    History reviewed. No pertinent past medical history.  Patient Active Problem List   Diagnosis Date Noted  . Allergic state 01/11/2015  . Depression, major, single episode, in partial remission ([place]) 01/11/2015  . Hot flash, menopausal 01/11/2015  . Nerve pain 01/11/2015    Past Surgical History:  Procedure Laterality Date  . EXCISIONAL HEMORRHOIDECTOMY  2002  . fibroidectomy  1990  . GANGLION CYST EXCISION  2007  . INGUINAL HERNIA REPAIR Left 2015    OB History    No data available       Home Medications    Prior to Admission medications   Medication Sig Start Date End Date Taking? Authorizing Provider  FLUoxetine (PROZAC) 10 MG tablet Take 1 tablet (10 mg total) by mouth daily. 11/11/16  Yes Glean Hess, MD  fluticasone [facility]) 50 MCG/ACT nasal spray Place 2 sprays into both nostrils daily.   Yes [provider]  LORazepam (ATIVAN) 0.5 MG tablet Take 1 tablet (0.5 mg total) by mouth at bedtime. 11/11/16  Yes Glean Hess, MD  tiZANidine (ZANAFLEX) 4 MG tablet Take 1 tablet (4 mg total) by mouth 3 (three) times daily. 11/11/16  Yes Glean Hess, MD  albuterol (PROVENTIL HFA;VENTOLIN HFA) 108 (90 Base) MCG/ACT inhaler 1-2 puffs every 4-6 hours prn wheezing or cough 05/23/17   Norval Gable, MD  levofloxacin (LEVAQUIN) 500 MG tablet Take 1 tablet (500 mg total) by mouth daily. 05/13/17    Glean Hess, MD  mometasone (NASONEX) 50 MCG/ACT nasal spray Place 2 sprays into the nose daily. 11/04/15   Glean Hess, MD  predniSONE (DELTASONE) 20 MG tablet Take 1 tablet (20 mg total) by mouth daily. 05/23/17   Norval Gable, MD    Family History Family History  Problem Relation Age of Onset  . ALS Mother   . CAD Father   . Breast cancer Neg Hx     Social History Social History  Substance Use Topics  . Smoking status: Never Smoker  . Smokeless tobacco: Never Used  . Alcohol use 0.0 oz/week     Comment: occasionally     Allergies   Sertraline and Tegretol  [carbamazepine]   Review of Systems Review of Systems  Respiratory: Positive for shortness of breath.      Physical Exam Triage Vital Signs ED Triage Vitals  Enc Vitals Group     BP 05/23/17 1805 126/73     Pulse Rate 05/23/17 1805 77     Resp 05/23/17 1805 16     Temp 05/23/17 1805 98.7 F (37.1 C)     Temp Source 05/23/17 1805 Oral     SpO2 05/23/17 1805 100 %     Weight --      Height --      Head Circumference --      Peak Flow --  Pain Score 05/23/17 1806 0     Pain Loc --      Pain Edu? --      Excl. in [place]? --    No data found.   Updated Vital Signs BP 126/73 (BP Location: Left Arm)   Pulse 77   Temp 98.7 F (37.1 C) (Oral)   Resp 16   SpO2 100%   Visual Acuity Right Eye Distance:   Left Eye Distance:   Bilateral Distance:    Right Eye Near:   Left Eye Near:    Bilateral Near:     Physical Exam  Constitutional: She appears well-developed and well-nourished. No distress.  HENT:  Head: Normocephalic and atraumatic.  Right Ear: Tympanic membrane, external ear and ear canal normal.  Left Ear: Tympanic membrane, external ear and ear canal normal.  Nose: No mucosal edema, rhinorrhea, nose lacerations, sinus tenderness, nasal deformity, septal deviation or nasal septal hematoma. No epistaxis.  No foreign bodies. Right sinus exhibits no maxillary sinus tenderness and no  frontal sinus tenderness. Left sinus exhibits no maxillary sinus tenderness and no frontal sinus tenderness.  Mouth/Throat: Uvula is midline, oropharynx is clear and moist and mucous membranes are normal. No oropharyngeal exudate.  Eyes: Right eye exhibits no discharge. Left eye exhibits no discharge. No scleral icterus.  Neck: Normal range of motion. Neck supple. No thyromegaly present.  Cardiovascular: Normal rate, regular rhythm and normal heart sounds.   Pulmonary/Chest: Effort normal and breath sounds normal. No respiratory distress. She has no wheezes. She has no rales.  Lymphadenopathy:    She has no cervical adenopathy.  Skin: She is not diaphoretic.  Nursing note and vitals reviewed.    UC Treatments / Results  Labs (all labs ordered are listed, but only abnormal results are displayed) Labs Reviewed - No data to display  EKG  EKG Interpretation None       Radiology No results found.  Procedures Procedures (including critical care time)  Medications Ordered in UC Medications - No data to display   Initial Impression / Assessment and Plan / UC Course  I have reviewed the triage vital signs and the nursing notes.  Pertinent labs & imaging results that were available during my care of the patient were reviewed by me and considered in my medical decision making (see chart for details).       Final Clinical Impressions(s) / UC Diagnoses   Final diagnoses:  Cough  Bronchospasm    New Prescriptions Discharge Medication List as of 05/23/2017  7:18 PM    START taking these medications   Details  albuterol (PROVENTIL HFA;VENTOLIN HFA) 108 (90 Base) MCG/ACT inhaler 1-2 puffs every 4-6 hours prn wheezing or cough, Normal    predniSONE (DELTASONE) 20 MG tablet Take 1 tablet (20 mg total) by mouth daily., Starting Mon 05/23/2017, Normal       1. diagnosis reviewed with patient 2. rx as per orders above; reviewed possible side effects, interactions, risks and  benefits  3. Recommend supportive treatment with increased fluids 4. Follow-up prn if symptoms worsen or don't improve  Controlled Substance Prescriptions [place] Controlled Substance Registry consulted? Not Applicable   Norval Gable, MD 05/23/17 [phone]

## 2017-05-23 NOTE — ED Triage Notes (Signed)
Patient is having symptoms of SOB, weakness, and cough that started 2 weeks ago. Patient was treated on 05/13/17 for bronchitis and symptoms have not resolved.

## 2017-06-28 ENCOUNTER — Ambulatory Visit (INDEPENDENT_AMBULATORY_CARE_PROVIDER_SITE_OTHER): Payer: 59 | Admitting: Internal Medicine

## 2017-06-28 ENCOUNTER — Ambulatory Visit
Admission: RE | Admit: 2017-06-28 | Discharge: 2017-06-28 | Disposition: A | Discharge status: Home / Self Care | Payer: 59 | Source: Ambulatory Visit | Attending: Internal Medicine | Admitting: Internal Medicine

## 2017-06-28 ENCOUNTER — Encounter: Payer: Self-pay | Admitting: Internal Medicine

## 2017-06-28 VITALS — BP 112/72 | HR 82 | Temp 98.9°F | Ht 67.0 in | Wt 189.0 lb

## 2017-06-28 DIAGNOSIS — R05 Cough: Secondary | ICD-10-CM

## 2017-06-28 DIAGNOSIS — R059 Cough, unspecified: Secondary | ICD-10-CM

## 2017-06-28 DIAGNOSIS — K219 Gastro-esophageal reflux disease without esophagitis: Secondary | ICD-10-CM | POA: Diagnosis not present

## 2017-06-28 DIAGNOSIS — F324 Major depressive disorder, single episode, in partial remission: Secondary | ICD-10-CM

## 2017-06-28 MED ORDER — ALBUTEROL SULFATE HFA 108 (90 BASE) MCG/ACT IN AERS: 2.0000 | INHALATION_SPRAY | Freq: Four times a day (QID) | RESPIRATORY_TRACT | 0 refills | Status: DC | PRN

## 2017-06-28 MED ORDER — OMEPRAZOLE 20 MG PO CPDR: 20.0000 mg | DELAYED_RELEASE_CAPSULE | Freq: Two times a day (BID) | ORAL | 0 refills | Status: DC

## 2017-06-28 MED ORDER — LORAZEPAM 0.5 MG PO TABS: 0.5000 mg | ORAL_TABLET | Freq: Every day | ORAL | 0 refills | Status: DC

## 2017-06-28 NOTE — Patient Instructions (Signed)
Dulera 100/5 - one puff twice a day  Continue albuterol inhaler every 3-4 hours if needed  Take Omeprazole 20 mg twice a day

## 2017-06-28 NOTE — Progress Notes (Signed)
Date:  06/28/2017   Name:  Casey Marquez   DOB:  Aug 08, 1961   MRN:  [id number]   Chief Complaint: Cough (Been sick for the last two months. States it got slightly better but never completely went away. Feels weak and short winded. Sometimes there is a little mucous that comes up with cough. )  She has no history of smoking, asthma or lung disease.  She denies weight loss or night sweats but does get diaphoretic with prolonged coughing spells. She was diagnosed with Casey Marquez several years ago by ENT.  Took PPI briefly but does not take it now. The albuterol from UC does seem to help her chest discomfort that occurs anteriorly with cough and deep breathing.   Cough  This is a chronic problem. Episode onset: 8 weeks ago. The problem occurs every few minutes. The cough is non-productive. Associated symptoms include heartburn, postnasal drip (chronic), shortness of breath and sweats. Pertinent negatives include no chest pain, chills, fever or wheezing. The symptoms are aggravated by exercise (talking but not lying down). She has tried a beta-agonist inhaler for the symptoms. The treatment provided mild relief.  Depression         This is a chronic problem.  The problem has been gradually worsening (unsure if this is hormonal or due to recent sx and feeling isolated) since onset.  Associated symptoms include no fatigue, not irritable and no suicidal ideas.     Exacerbated by: not much - feels that her life is great.  Past treatments include SSRIs - Selective serotonin reuptake inhibitors (and prn benzos).  Previous treatment provided moderate relief.     Review of Systems  Constitutional: Positive for diaphoresis. Negative for chills, fatigue, fever and unexpected weight change.  HENT: Positive for postnasal drip (chronic). Negative for trouble swallowing and voice change.   Eyes: Negative for visual disturbance.  Respiratory: Positive for cough and shortness of breath. Negative for wheezing.     Cardiovascular: Positive for palpitations (at times). Negative for chest pain.  Gastrointestinal: Positive for heartburn. Negative for abdominal pain, diarrhea and nausea.       Mild reflux intermittently but not especially worse at night.   Genitourinary: Negative for vaginal bleeding and vaginal discharge.  Hematological: Positive for adenopathy (under left chin).  Psychiatric/Behavioral: Positive for depression and dysphoric mood. Negative for suicidal ideas. The patient is nervous/anxious.     Patient Active Problem List   Diagnosis Date Noted  . Allergic state 01/11/2015  . Depression, major, single episode, in partial remission (Casey Marquez) 01/11/2015  . Hot flash, menopausal 01/11/2015  . Nerve pain 01/11/2015    Prior to Admission medications   Medication Sig Start Date End Date Taking? Authorizing Provider  albuterol (PROVENTIL HFA;VENTOLIN HFA) 108 (90 Base) MCG/ACT inhaler 1-2 puffs every 4-6 hours prn wheezing or cough 05/23/17  Yes Conty, [place], MD  FLUoxetine (PROZAC) 10 MG tablet Take 1 tablet (10 mg total) by mouth daily. 11/11/16  Yes Glean Hess, MD  fluticasone [facility]) 50 MCG/ACT nasal spray Place 2 sprays into both nostrils daily.   Yes [provider]  LORazepam (ATIVAN) 0.5 MG tablet Take 1 tablet (0.5 mg total) by mouth at bedtime. 11/11/16  Yes Glean Hess, MD  tiZANidine (ZANAFLEX) 4 MG tablet Take 1 tablet (4 mg total) by mouth 3 (three) times daily. 11/11/16  Yes Glean Hess, MD    Allergies  Allergen Reactions  . Sertraline Nausea Only  . Tegretol  [Carbamazepine]  confusion    Past Surgical History:  Procedure Laterality Date  . EXCISIONAL HEMORRHOIDECTOMY  2002  . fibroidectomy  1990  . GANGLION CYST EXCISION  2007  . INGUINAL HERNIA REPAIR Left 2015    Social History   Tobacco Use  . Smoking status: Never Smoker  . Smokeless tobacco: Never Used  Substance Use Topics  . Alcohol use: Yes    Alcohol/week: 0.0 oz     Comment: occasionally  . Drug use: No     Medication list has been reviewed and updated.  PHQ 2/9 Scores 05/13/2017  PHQ - 2 Score 2  PHQ- 9 Score 5  Exception Documentation Other- indicate reason in comment box  Not completed stir crazy but nothing out of the ordinary - per patient    Physical Exam  Constitutional: She is oriented to person, place, and time. She appears well-developed. She is not irritable. She has a sickly appearance. No distress.  HENT:  Head: Normocephalic and atraumatic.  Right Ear: Tympanic membrane and ear canal normal.  Left Ear: Tympanic membrane and ear canal normal.  Nose: Right sinus exhibits no maxillary sinus tenderness. Left sinus exhibits no maxillary sinus tenderness.  Mouth/Throat: No posterior oropharyngeal erythema.  Neck:    Cardiovascular: Normal rate, regular rhythm and normal heart sounds.  Pulmonary/Chest: Effort normal. No respiratory distress. She has no decreased breath sounds. She has no wheezes. She has no rhonchi.  Musculoskeletal: Normal range of motion.  Neurological: She is alert and oriented to person, place, and time.  Skin: Skin is warm and dry. No rash noted.  Psychiatric: Her speech is normal and behavior is normal. Thought content normal. Her mood appears anxious. She exhibits a depressed mood.  Nursing note and vitals reviewed.   BP 112/72   Pulse 82   Temp 98.9 F (37.2 C) (Oral)   Ht 5\' 7"  (1.702 m)   Wt 189 lb (85.7 kg)   SpO2 98%   BMI 29.60 kg/m   Assessment and Plan: 1. Cough in adult DDX asthma, Casey Marquez, atypical chest infection Sample of dulera 100/50 one puff twice a day Close follow up - CBC with Differential/Platelet - albuterol (PROVENTIL HFA;VENTOLIN HFA) 108 (90 Base) MCG/ACT inhaler; Inhale 2 puffs every 6 (six) hours as needed into the lungs for wheezing or shortness of breath.  Dispense: 1 Inhaler; Refill: 0 - DG Chest 2 View; Future  2. Gastroesophageal reflux disease, esophagitis presence not  specified Begin PPI to control acid - omeprazole (PRILOSEC) 20 MG capsule; Take 1 capsule (20 mg total) 2 (two) times daily before a meal by mouth.  Dispense: 60 capsule; Refill: 0  3. Depression, major, single episode, in partial remission ([place]) Continue Prozac Use benzo as needed for now Will discuss next week - LORazepam (ATIVAN) 0.5 MG tablet; Take 1 tablet (0.5 mg total) at bedtime by mouth.  Dispense: 30 tablet; Refill: 0   Meds ordered this encounter  Medications  . albuterol (PROVENTIL HFA;VENTOLIN HFA) 108 (90 Base) MCG/ACT inhaler    Sig: Inhale 2 puffs every 6 (six) hours as needed into the lungs for wheezing or shortness of breath.    Dispense:  1 Inhaler    Refill:  0  . omeprazole (PRILOSEC) 20 MG capsule    Sig: Take 1 capsule (20 mg total) 2 (two) times daily before a meal by mouth.    Dispense:  60 capsule    Refill:  0  . LORazepam (ATIVAN) 0.5 MG tablet    Sig:  Take 1 tablet (0.5 mg total) at bedtime by mouth.    Dispense:  30 tablet    Refill:  0    Partially dictated using Editor, commissioning. Any errors are unintentional.  Halina Maidens, MD [facility]  06/28/2017

## 2017-06-29 LAB — CBC WITH DIFFERENTIAL/PLATELET
BASOS ABS: 0.1 10*3/uL (ref 0.0–0.2)
BASOS: 1 %
EOS (ABSOLUTE): 0.2 10*3/uL (ref 0.0–0.4)
Eos: 2 %
Hematocrit: 41.8 % (ref 34.0–46.6)
Hemoglobin: 13.8 g/dL (ref 11.1–15.9)
IMMATURE GRANS (ABS): 0 10*3/uL (ref 0.0–0.1)
IMMATURE GRANULOCYTES: 0 %
LYMPHS: 46 %
Lymphocytes Absolute: 3.7 10*3/uL — ABNORMAL HIGH (ref 0.7–3.1)
MCH: 29 pg (ref 26.6–33.0)
MCHC: 33 g/dL (ref 31.5–35.7)
MCV: 88 fL (ref 79–97)
MONOCYTES: 5 %
Monocytes Absolute: 0.4 10*3/uL (ref 0.1–0.9)
NEUTROS PCT: 46 %
Neutrophils Absolute: 3.7 10*3/uL (ref 1.4–7.0)
PLATELETS: 345 10*3/uL (ref 150–379)
RBC: 4.76 x10E6/uL (ref 3.77–5.28)
RDW: 13.4 % (ref 12.3–15.4)
WBC: 8.1 10*3/uL (ref 3.4–10.8)

## 2017-07-05 ENCOUNTER — Encounter: Payer: Self-pay | Admitting: Internal Medicine

## 2017-07-05 ENCOUNTER — Ambulatory Visit (INDEPENDENT_AMBULATORY_CARE_PROVIDER_SITE_OTHER): Payer: 59 | Admitting: Internal Medicine

## 2017-07-05 VITALS — BP 110/80 | HR 82 | Ht 67.0 in | Wt 189.0 lb

## 2017-07-05 DIAGNOSIS — J9801 Acute bronchospasm: Secondary | ICD-10-CM

## 2017-07-05 DIAGNOSIS — K219 Gastro-esophageal reflux disease without esophagitis: Secondary | ICD-10-CM | POA: Diagnosis not present

## 2017-07-05 DIAGNOSIS — F324 Major depressive disorder, single episode, in partial remission: Secondary | ICD-10-CM

## 2017-07-05 HISTORY — DX: Acute bronchospasm: J98.01

## 2017-07-05 MED ORDER — MOMETASONE FURO-FORMOTEROL FUM 100-5 MCG/ACT IN AERO: 2.0000 | INHALATION_SPRAY | Freq: Two times a day (BID) | RESPIRATORY_TRACT | 5 refills | Status: DC

## 2017-07-05 NOTE — Progress Notes (Signed)
Date:  07/05/2017   Name:  Casey Marquez   DOB:  January 25, 1961   MRN:  [id number]   Chief Complaint: Follow-up (1 week follow up - cough. Quite a bit better but not all the way. Still having some coughing but not as bad. )  Cough  This is a recurrent problem. The problem has been gradually improving. The problem occurs every few minutes. The cough is non-productive. Associated symptoms include wheezing. Pertinent negatives include no chest pain, chills, fever or heartburn. She has tried a beta-agonist inhaler and steroid inhaler (she is 80% improved) for the symptoms.  Gastroesophageal Reflux  She complains of coughing and wheezing. She reports no chest pain, no heartburn, no hoarse voice or no nausea. This is a chronic problem. Pertinent negatives include no fatigue. She has tried a PPI for the symptoms.   Depression - continues on Prozac.  Feeling better and resting better with lorazepam PRN.  Review of Systems  Constitutional: Negative for chills, fatigue and fever.  HENT: Negative for hoarse voice.   Respiratory: Positive for cough and wheezing. Negative for chest tightness.   Cardiovascular: Positive for palpitations. Negative for chest pain and leg swelling.  Gastrointestinal: Negative for heartburn and nausea.  Psychiatric/Behavioral: Negative for dysphoric mood. The patient is not nervous/anxious.     Patient Active Problem List   Diagnosis Date Noted  . Cough in adult 06/28/2017  . Gastroesophageal reflux disease 06/28/2017  . Allergic state 01/11/2015  . Depression, major, single episode, in partial remission ([place]) 01/11/2015  . Hot flash, menopausal 01/11/2015  . Nerve pain 01/11/2015    Prior to Admission medications   Medication Sig Start Date End Date Taking? Authorizing Provider  albuterol (PROVENTIL HFA;VENTOLIN HFA) 108 (90 Base) MCG/ACT inhaler 1-2 puffs every 4-6 hours prn wheezing or cough 05/23/17  Yes Conty, Orlando, MD  albuterol (PROVENTIL HFA;VENTOLIN  HFA) 108 (90 Base) MCG/ACT inhaler Inhale 2 puffs every 6 (six) hours as needed into the lungs for wheezing or shortness of breath. 06/28/17  Yes Glean Hess, MD  FLUoxetine (PROZAC) 10 MG tablet Take 1 tablet (10 mg total) by mouth daily. 11/11/16  Yes Glean Hess, MD  fluticasone [facility]) 50 MCG/ACT nasal spray Place 2 sprays into both nostrils daily.   Yes [provider]  LORazepam (ATIVAN) 0.5 MG tablet Take 1 tablet (0.5 mg total) at bedtime by mouth. 06/28/17  Yes Glean Hess, MD  omeprazole (PRILOSEC) 20 MG capsule Take 1 capsule (20 mg total) 2 (two) times daily before a meal by mouth. 06/28/17  Yes Glean Hess, MD  tiZANidine (ZANAFLEX) 4 MG tablet Take 1 tablet (4 mg total) by mouth 3 (three) times daily. 11/11/16  Yes Glean Hess, MD    Allergies  Allergen Reactions  . Sertraline Nausea Only  . Tegretol  [Carbamazepine]     confusion    Past Surgical History:  Procedure Laterality Date  . EXCISIONAL HEMORRHOIDECTOMY  2002  . fibroidectomy  1990  . GANGLION CYST EXCISION  2007  . INGUINAL HERNIA REPAIR Left 2015    Social History   Tobacco Use  . Smoking status: Never Smoker  . Smokeless tobacco: Never Used  Substance Use Topics  . Alcohol use: Yes    Alcohol/week: 0.0 oz    Comment: occasionally  . Drug use: No     Medication list has been reviewed and updated.  PHQ 2/9 Scores 05/13/2017  PHQ - 2 Score 2  PHQ- 9 Score  5  Exception Documentation Other- indicate reason in comment box  Not completed stir crazy but nothing out of the ordinary - per patient    Physical Exam  Constitutional: She is oriented to person, place, and time. She appears well-developed. No distress.  HENT:  Head: Normocephalic and atraumatic.  Neck: Normal range of motion. Neck supple.  Cardiovascular: Normal rate, regular rhythm and normal heart sounds.  Pulmonary/Chest: Effort normal. No respiratory distress. She has wheezes (only with cough and  laugh).  Musculoskeletal: Normal range of motion. She exhibits no edema or tenderness.  Neurological: She is alert and oriented to person, place, and time.  Skin: Skin is warm and dry. No rash noted.  Psychiatric: She has a normal mood and affect. Her behavior is normal. Thought content normal.  Nursing note and vitals reviewed.   BP 110/80   Pulse 82   Ht 5\' 7"  (1.702 m)   Wt 189 lb (85.7 kg)   SpO2 99%   BMI 29.60 kg/m   Assessment and Plan: 1. Bronchospasm Improving - continue Dulera bid and Prn Albuterol - mometasone-formoterol (DULERA) 100-5 MCG/ACT AERO; Inhale 2 puffs 2 (two) times daily into the lungs.  Dispense: 13 g; Refill: 5  2. Gastroesophageal reflux disease without esophagitis Continue bid omeprazole - will try to taper after next visit if doing well  3. Depression, major, single episode, in partial remission ([place]) Continue Prozac Continue prn lorazepam   Meds ordered this encounter  Medications  . mometasone-formoterol (DULERA) 100-5 MCG/ACT AERO    Sig: Inhale 2 puffs 2 (two) times daily into the lungs.    Dispense:  13 g    Refill:  5    Partially dictated using Editor, commissioning. Any errors are unintentional.  Halina Maidens, MD [facility]  07/05/2017

## 2017-08-23 HISTORY — PX: TOTAL ABDOMINAL HYSTERECTOMY: SHX209

## 2017-08-26 ENCOUNTER — Encounter: Payer: Self-pay | Admitting: Internal Medicine

## 2017-08-26 ENCOUNTER — Ambulatory Visit (INDEPENDENT_AMBULATORY_CARE_PROVIDER_SITE_OTHER): Payer: 59 | Admitting: Internal Medicine

## 2017-08-26 VITALS — BP 128/82 | HR 75 | Ht 67.0 in | Wt 195.0 lb

## 2017-08-26 DIAGNOSIS — F324 Major depressive disorder, single episode, in partial remission: Secondary | ICD-10-CM | POA: Diagnosis not present

## 2017-08-26 DIAGNOSIS — Z23 Encounter for immunization: Secondary | ICD-10-CM

## 2017-08-26 DIAGNOSIS — K219 Gastro-esophageal reflux disease without esophagitis: Secondary | ICD-10-CM | POA: Diagnosis not present

## 2017-08-26 DIAGNOSIS — J9801 Acute bronchospasm: Secondary | ICD-10-CM | POA: Diagnosis not present

## 2017-08-26 MED ORDER — MOMETASONE FURO-FORMOTEROL FUM 100-5 MCG/ACT IN AERO: 2.0000 | INHALATION_SPRAY | Freq: Two times a day (BID) | RESPIRATORY_TRACT | 5 refills | Status: DC

## 2017-08-26 MED ORDER — OMEPRAZOLE 20 MG PO CPDR: 20.0000 mg | DELAYED_RELEASE_CAPSULE | Freq: Every day | ORAL | 1 refills | Status: AC

## 2017-08-26 NOTE — Progress Notes (Signed)
Date:  08/26/2017   Name:  Casey Marquez   DOB:  1961-01-20   MRN:  [id number]   Chief Complaint: Gastroesophageal Reflux (Using once daily. Wants refill on meds.); Follow-up (Using inhalers as needed- doing much better from past illness. Needs refill on both inhalers. Was given samples for dulera inhaler last time. ); and Immunizations (Flu Shot. )  Gastroesophageal Reflux  She complains of heartburn and wheezing. She reports no abdominal pain or no chest pain. This is a new problem. The current episode started more than 1 month ago. The problem occurs occasionally. Pertinent negatives include no fatigue. She has tried a PPI for the symptoms. The treatment provided significant relief.  Shortness of Breath  This is a new problem. The current episode started more than 1 month ago. The problem occurs intermittently. The problem has been gradually improving. Associated symptoms include wheezing. Pertinent negatives include no abdominal pain, chest pain or fever. She has tried steroid inhalers and beta agonist inhalers for the symptoms. The treatment provided significant relief.  Depression         This is a chronic problem.  The problem occurs rarely.  The problem has been resolved since onset.  Associated symptoms include no fatigue.  Past treatments include SSRIs - Selective serotonin reuptake inhibitors.     Review of Systems  Constitutional: Negative for chills, fatigue and fever.  Respiratory: Positive for chest tightness, shortness of breath and wheezing.   Cardiovascular: Negative for chest pain and palpitations.  Gastrointestinal: Positive for heartburn. Negative for abdominal pain.  Musculoskeletal: Negative for arthralgias.  Allergic/Immunologic: Negative for environmental allergies.  Psychiatric/Behavioral: Positive for depression. Negative for dysphoric mood and sleep disturbance.    Patient Active Problem List   Diagnosis Date Noted  . Bronchospasm 07/05/2017  . Cough in  adult 06/28/2017  . Gastroesophageal reflux disease 06/28/2017  . Allergic state 01/11/2015  . Depression, major, single episode, in partial remission ([place]) 01/11/2015  . Hot flash, menopausal 01/11/2015  . Nerve pain 01/11/2015    Prior to Admission medications   Medication Sig Start Date End Date Taking? Authorizing Provider  albuterol (PROVENTIL HFA;VENTOLIN HFA) 108 (90 Base) MCG/ACT inhaler 1-2 puffs every 4-6 hours prn wheezing or cough 05/23/17  Yes Conty, Orlando, MD  FLUoxetine (PROZAC) 10 MG tablet Take 1 tablet (10 mg total) by mouth daily. 11/11/16  Yes Glean Hess, MD  fluticasone [facility]) 50 MCG/ACT nasal spray Place 2 sprays into both nostrils daily.   Yes [provider]  LORazepam (ATIVAN) 0.5 MG tablet Take 1 tablet (0.5 mg total) at bedtime by mouth. 06/28/17  Yes Glean Hess, MD  mometasone-formoterol (DULERA) 100-5 MCG/ACT AERO Inhale 2 puffs 2 (two) times daily into the lungs. 07/05/17  Yes Glean Hess, MD  omeprazole (PRILOSEC) 20 MG capsule Take 1 capsule (20 mg total) 2 (two) times daily before a meal by mouth. 06/28/17  Yes Glean Hess, MD  tiZANidine (ZANAFLEX) 4 MG tablet Take 1 tablet (4 mg total) by mouth 3 (three) times daily. 11/11/16  Yes Glean Hess, MD    Allergies  Allergen Reactions  . Sertraline Nausea Only  . Tegretol  [Carbamazepine]     confusion    Past Surgical History:  Procedure Laterality Date  . EXCISIONAL HEMORRHOIDECTOMY  2002  . fibroidectomy  1990  . GANGLION CYST EXCISION  2007  . INGUINAL HERNIA REPAIR Left 2015    Social History   Tobacco Use  . Smoking status:  Never Smoker  . Smokeless tobacco: Never Used  Substance Use Topics  . Alcohol use: Yes    Alcohol/week: 0.0 oz    Comment: occasionally  . Drug use: No     Medication list has been reviewed and updated.  PHQ 2/9 Scores 08/26/2017 05/13/2017  PHQ - 2 Score 4 2  PHQ- 9 Score - 5  Exception Documentation Patient refusal  Other- indicate reason in comment box  Not completed - stir crazy but nothing out of the ordinary - per patient    Physical Exam  Constitutional: She is oriented to person, place, and time. She appears well-developed. No distress.  HENT:  Head: Normocephalic and atraumatic.  Neck: Normal range of motion. Neck supple. No thyromegaly present.  Cardiovascular: Normal rate, regular rhythm and normal heart sounds.  Pulmonary/Chest: Effort normal and breath sounds normal. No respiratory distress. She has no wheezes.  Musculoskeletal: Normal range of motion. She exhibits no edema.  Neurological: She is alert and oriented to person, place, and time.  Skin: Skin is warm and dry. No rash noted.  Psychiatric: She has a normal mood and affect. Her behavior is normal. Thought content normal.  Nursing note and vitals reviewed.   BP 128/82   Pulse 75   Ht 5\' 7"  (1.702 m)   Wt 195 lb (88.5 kg)   SpO2 96%   BMI 30.54 kg/m   Assessment and Plan: 1. Gastroesophageal reflux disease without esophagitis Continue once daily PPI - omeprazole (PRILOSEC) 20 MG capsule; Take 1 capsule (20 mg total) by mouth daily.  Dispense: 90 capsule; Refill: 1  2. Bronchospasm Improved - continue Dulera - mometasone-formoterol (DULERA) 100-5 MCG/ACT AERO; Inhale 2 puffs into the lungs 2 (two) times daily.  Dispense: 13 g; Refill: 5  3. Need for influenza vaccination - Flu Vaccine QUAD 36+ mos IM  4. Depression, major, single episode, in partial remission ([place]) Doing well on Prozac   Meds ordered this encounter  Medications  . omeprazole (PRILOSEC) 20 MG capsule    Sig: Take 1 capsule (20 mg total) by mouth daily.    Dispense:  90 capsule    Refill:  1  . mometasone-formoterol (DULERA) 100-5 MCG/ACT AERO    Sig: Inhale 2 puffs into the lungs 2 (two) times daily.    Dispense:  13 g    Refill:  5    Partially dictated using Editor, commissioning. Any errors are unintentional.  Halina Maidens, MD [facility]  08/26/2017

## 2017-09-21 ENCOUNTER — Other Ambulatory Visit: Payer: Self-pay | Admitting: Internal Medicine

## 2017-09-21 DIAGNOSIS — F324 Major depressive disorder, single episode, in partial remission: Secondary | ICD-10-CM

## 2017-09-26 ENCOUNTER — Telehealth: Payer: Self-pay

## 2017-09-26 NOTE — Telephone Encounter (Signed)
Did Prior Auth on patient's medication for [facility] Inhaler with CoverMyMeds. Will receive response within 72 hours.

## 2017-09-27 ENCOUNTER — Other Ambulatory Visit: Payer: Self-pay | Admitting: Internal Medicine

## 2017-09-27 MED ORDER — BUDESONIDE-FORMOTEROL FUMARATE 80-4.5 MCG/ACT IN AERO: 2.0000 | INHALATION_SPRAY | Freq: Two times a day (BID) | RESPIRATORY_TRACT | 0 refills | Status: DC

## 2017-11-09 IMAGING — CR DG CHEST 2V
2 series · 2 of 2 positions shown · non-contrast
Comparison: None.

CLINICAL DATA: Chronic cough x2 months

EXAM:
CHEST  2 VIEW

[chest pa]
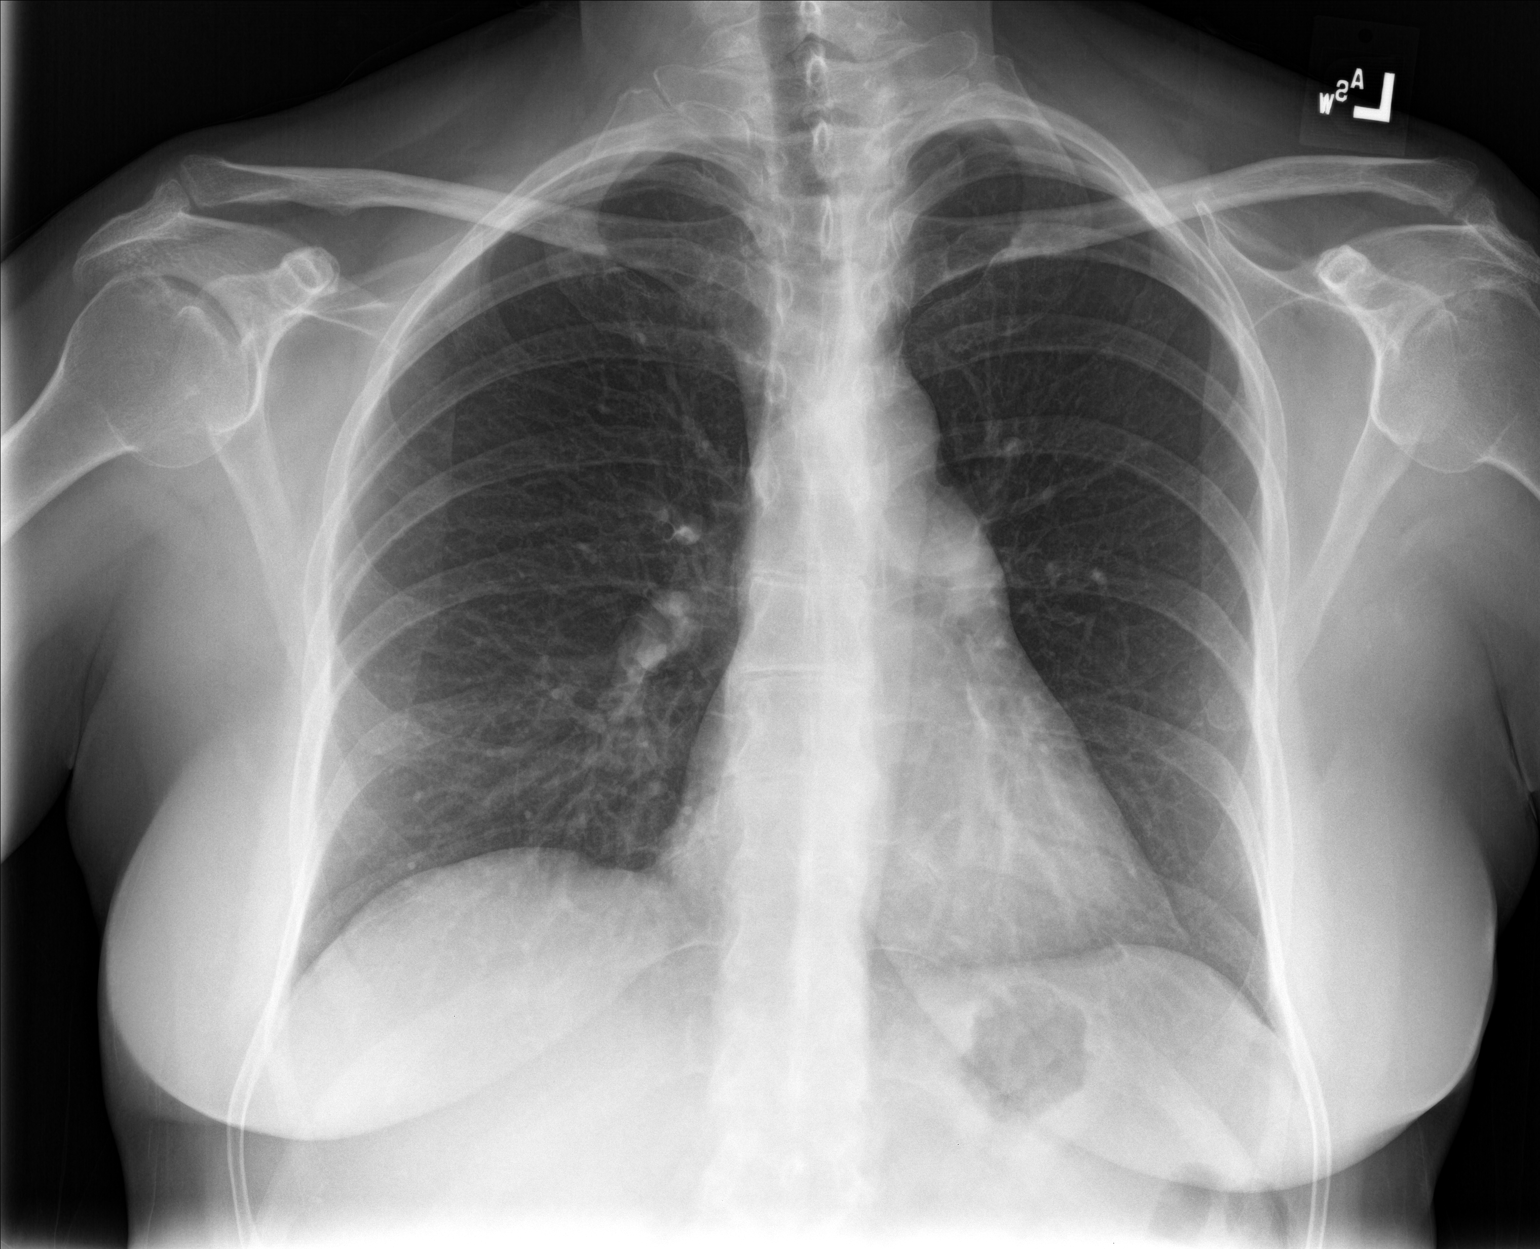

[chest lat]
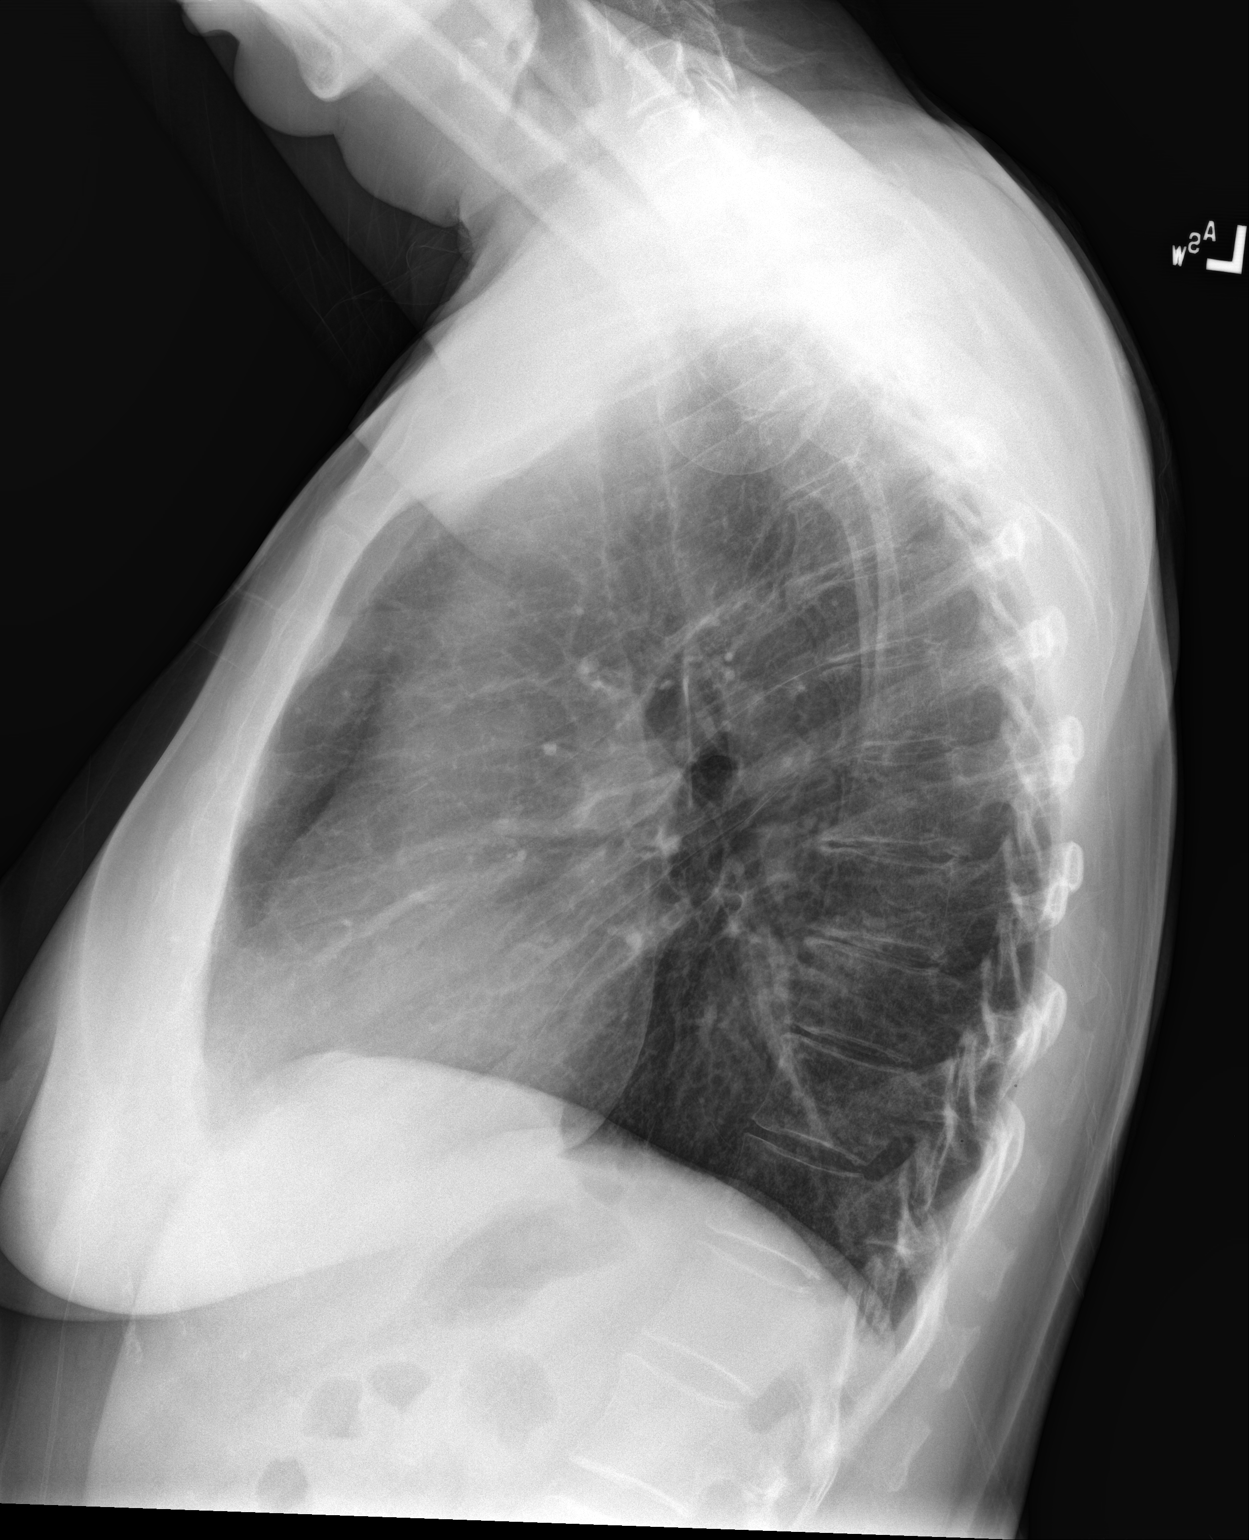

[2 of 2 positions shown; findings below may reference images not displayed]

FINDINGS: Lungs are clear.  No pleural effusion or pneumothorax.

The heart is normal size.

Mild degenerative changes of the visualized thoracolumbar spine.
IMPRESSION: Normal chest radiographs.

## 2017-11-15 ENCOUNTER — Encounter: Payer: 59 | Admitting: Internal Medicine

## 2018-01-31 ENCOUNTER — Ambulatory Visit (INDEPENDENT_AMBULATORY_CARE_PROVIDER_SITE_OTHER): Payer: 59 | Admitting: Internal Medicine

## 2018-01-31 ENCOUNTER — Encounter: Payer: Self-pay | Admitting: Internal Medicine

## 2018-01-31 VITALS — BP 118/68 | HR 76 | Resp 16 | Ht 67.0 in | Wt 202.0 lb

## 2018-01-31 DIAGNOSIS — R102 Pelvic and perineal pain: Secondary | ICD-10-CM

## 2018-01-31 NOTE — Progress Notes (Signed)
Date:  01/31/2018   Name:  Casey Marquez   DOB:  04/19/1961   MRN:  [id number]   Chief Complaint: Abdominal Pain (LLQ ) Abdominal Pain  This is a new problem. The current episode started in the past 7 days. The problem has been gradually worsening. The pain is located in the LLQ. The quality of the pain is sharp, tearing and colicky. The abdominal pain does not radiate. Pertinent negatives include no constipation, diarrhea, fever, frequency, headaches or hematuria.      Review of Systems  Constitutional: Negative for chills, fatigue and fever.  Respiratory: Negative for chest tightness, shortness of breath and wheezing.   Cardiovascular: Negative for chest pain and palpitations.  Gastrointestinal: Positive for abdominal pain. Negative for blood in stool, constipation and diarrhea.  Genitourinary: Negative for difficulty urinating, frequency and hematuria.  Allergic/Immunologic: Negative for environmental allergies.  Neurological: Negative for dizziness and headaches.    Patient Active Problem List   Diagnosis Date Noted  . Bronchospasm 07/05/2017  . Gastroesophageal reflux disease 06/28/2017  . Allergic state 01/11/2015  . Depression, major, single episode, in partial remission ([place]) 01/11/2015  . Hot flash, menopausal 01/11/2015  . Nerve pain 01/11/2015    Prior to Admission medications   Medication Sig Start Date End Date Taking? Authorizing Provider  albuterol (PROVENTIL HFA;VENTOLIN HFA) 108 (90 Base) MCG/ACT inhaler 1-2 puffs every 4-6 hours prn wheezing or cough 05/23/17  Yes Conty, Orlando, MD  FLUoxetine (PROZAC) 10 MG tablet TAKE 1 TABLET(10 MG) BY MOUTH DAILY 09/22/17  Yes Glean Hess, MD  fluticasone [facility]) 50 MCG/ACT nasal spray Place 2 sprays into both nostrils daily.   Yes [provider]  LORazepam (ATIVAN) 0.5 MG tablet TAKE 1 TABLET BY MOUTH AT BEDTIME 09/22/17  Yes Glean Hess, MD  omeprazole (PRILOSEC) 20 MG capsule Take 1 capsule (20  mg total) by mouth daily. 08/26/17  Yes Glean Hess, MD  tiZANidine (ZANAFLEX) 4 MG tablet Take 1 tablet (4 mg total) by mouth 3 (three) times daily. 11/11/16  Yes Glean Hess, MD  budesonide-formoterol [facility]) 80-4.5 MCG/ACT inhaler Inhale 2 puffs into the lungs 2 (two) times daily. Patient not taking: Reported on 01/31/2018 09/27/17   Glean Hess, MD    Allergies  Allergen Reactions  . Sertraline Nausea Only  . Tegretol  [Carbamazepine]     confusion    Past Surgical History:  Procedure Laterality Date  . EXCISIONAL HEMORRHOIDECTOMY  2002  . fibroidectomy  1990  . GANGLION CYST EXCISION  2007  . INGUINAL HERNIA REPAIR Left 2015    Social History   Tobacco Use  . Smoking status: Never Smoker  . Smokeless tobacco: Never Used  Substance Use Topics  . Alcohol use: Yes    Alcohol/week: 0.0 oz    Comment: occasionally  . Drug use: No     Medication list has been reviewed and updated.  Current Meds  Medication Sig  . albuterol (PROVENTIL HFA;VENTOLIN HFA) 108 (90 Base) MCG/ACT inhaler 1-2 puffs every 4-6 hours prn wheezing or cough  . FLUoxetine (PROZAC) 10 MG tablet TAKE 1 TABLET(10 MG) BY MOUTH DAILY  . fluticasone (FLONASE) 50 MCG/ACT nasal spray Place 2 sprays into both nostrils daily.  Marland Kitchen LORazepam (ATIVAN) 0.5 MG tablet TAKE 1 TABLET BY MOUTH AT BEDTIME  . omeprazole (PRILOSEC) 20 MG capsule Take 1 capsule (20 mg total) by mouth daily.  Marland Kitchen tiZANidine (ZANAFLEX) 4 MG tablet Take 1 tablet (4 mg total) by  mouth 3 (three) times daily.    PHQ 2/9 Scores 01/31/2018 08/26/2017 05/13/2017  PHQ - 2 Score 0 4 2  PHQ- 9 Score 2 - 5  Exception Documentation - Patient refusal Other- indicate reason in comment box  Not completed - - stir crazy but nothing out of the ordinary - per patient    Physical Exam  Constitutional: She is oriented to person, place, and time. She appears well-developed. No distress.  HENT:  Head: Normocephalic and atraumatic.    Pulmonary/Chest: Effort normal. No respiratory distress.  Abdominal: Soft. Normal appearance and normal aorta. She exhibits no mass. There is tenderness in the left lower quadrant. There is no rigidity and no guarding.  Musculoskeletal: Normal range of motion.  Neurological: She is alert and oriented to person, place, and time.  Skin: Skin is warm and dry. No rash noted.  Psychiatric: She has a normal mood and affect. Her behavior is normal. Thought content normal.  Nursing note and vitals reviewed.   BP 118/68   Pulse 76   Resp 16   Ht 5\' 7"  (1.702 m)   Wt 202 lb (91.6 kg)   SpO2 96%   BMI 31.64 kg/m   Assessment and Plan: 1. Pelvic pain in female Continue advil as needed - US Pelvis Complete; Future   No orders of the defined types were placed in this encounter.   Partially dictated using Editor, commissioning. Any errors are unintentional.  Halina Maidens, MD [facility]  01/31/2018

## 2018-02-06 NOTE — Addendum Note (Signed)
Addended by: Valere Dross on: 02/06/2018 03:27 PM   Modules accepted: Orders

## 2018-02-07 ENCOUNTER — Ambulatory Visit
Admission: RE | Admit: 2018-02-07 | Discharge: 2018-02-07 | Disposition: A | Discharge status: Home / Self Care | Payer: 59 | Source: Ambulatory Visit | Attending: Internal Medicine | Admitting: Internal Medicine

## 2018-02-07 ENCOUNTER — Other Ambulatory Visit: Payer: Self-pay | Admitting: Internal Medicine

## 2018-02-07 DIAGNOSIS — D219 Benign neoplasm of connective and other soft tissue, unspecified: Secondary | ICD-10-CM

## 2018-02-07 DIAGNOSIS — R102 Pelvic and perineal pain: Secondary | ICD-10-CM

## 2018-02-07 DIAGNOSIS — D250 Submucous leiomyoma of uterus: Secondary | ICD-10-CM | POA: Insufficient documentation

## 2018-03-01 ENCOUNTER — Ambulatory Visit (INDEPENDENT_AMBULATORY_CARE_PROVIDER_SITE_OTHER): Payer: 59 | Admitting: Obstetrics and Gynecology

## 2018-03-01 ENCOUNTER — Encounter: Payer: Self-pay | Admitting: Obstetrics and Gynecology

## 2018-03-01 VITALS — BP 133/81 | HR 87 | Ht 67.0 in | Wt 202.9 lb

## 2018-03-01 DIAGNOSIS — R102 Pelvic and perineal pain: Secondary | ICD-10-CM

## 2018-03-01 DIAGNOSIS — N951 Menopausal and female climacteric states: Secondary | ICD-10-CM | POA: Diagnosis not present

## 2018-03-01 DIAGNOSIS — D219 Benign neoplasm of connective and other soft tissue, unspecified: Secondary | ICD-10-CM

## 2018-03-01 NOTE — Patient Instructions (Signed)
Menopause Menopause is the normal time of life when menstrual periods stop completely. Menopause is complete when you have missed 12 consecutive menstrual periods. It usually occurs between the ages of 48 years and 55 years. Very rarely does a woman develop menopause before the age of 40 years. At menopause, your ovaries stop producing the female hormones estrogen and progesterone. This can cause undesirable symptoms and also affect your health. Sometimes the symptoms may occur 4-5 years before the menopause begins. There is no relationship between menopause and:  Oral contraceptives.  Number of children you had.  Race.  The age your menstrual periods started (menarche).  Heavy smokers and very thin women may develop menopause earlier in life. What are the causes?  The ovaries stop producing the female hormones estrogen and progesterone. Other causes include:  Surgery to remove both ovaries.  The ovaries stop functioning for no known reason.  Tumors of the pituitary gland in the brain.  Medical disease that affects the ovaries and hormone production.  Radiation treatment to the abdomen or pelvis.  Chemotherapy that affects the ovaries.  What are the signs or symptoms?  Hot flashes.  Night sweats.  Decrease in sex drive.  Vaginal dryness and thinning of the vagina causing painful intercourse.  Dryness of the skin and developing wrinkles.  Headaches.  Tiredness.  Irritability.  Memory problems.  Weight gain.  Bladder infections.  Hair growth of the face and chest.  Infertility. More serious symptoms include:  Loss of bone (osteoporosis) causing breaks (fractures).  Depression.  Hardening and narrowing of the arteries (atherosclerosis) causing heart attacks and strokes.  How is this diagnosed?  When the menstrual periods have stopped for 12 straight months.  Physical exam.  Hormone studies of the blood. How is this treated? There are many treatment  choices and nearly as many questions about them. The decisions to treat or not to treat menopausal changes is an individual choice made with your health care provider. Your health care provider can discuss the treatments with you. Together, you can decide which treatment will work best for you. Your treatment choices may include:  Hormone therapy (estrogen and progesterone).  Non-hormonal medicines.  Treating the individual symptoms with medicine (for example antidepressants for depression).  Herbal medicines that may help specific symptoms.  Counseling by a psychiatrist or psychologist.  Group therapy.  Lifestyle changes including: ? Eating healthy. ? Regular exercise. ? Limiting caffeine and alcohol. ? Stress management and meditation.  No treatment.  Follow these instructions at home:  Take the medicine your health care provider gives you as directed.  Get plenty of sleep and rest.  Exercise regularly.  Eat a diet that contains calcium (good for the bones) and soy products (acts like estrogen hormone).  Avoid alcoholic beverages.  Do not smoke.  If you have hot flashes, dress in layers.  Take supplements, calcium, and vitamin D to strengthen bones.  You can use over-the-counter lubricants or moisturizers for vaginal dryness.  Group therapy is sometimes very helpful.  Acupuncture may be helpful in some cases. Contact a health care provider if:  You are not sure you are in menopause.  You are having menopausal symptoms and need advice and treatment.  You are still having menstrual periods after age 55 years.  You have pain with intercourse.  Menopause is complete (no menstrual period for 12 months) and you develop vaginal bleeding.  You need a referral to a specialist (gynecologist, psychiatrist, or psychologist) for treatment. Get help right   away if:  You have severe depression.  You have excessive vaginal bleeding.  You fell and think you have a  broken bone.  You have pain when you urinate.  You develop leg or chest pain.  You have a fast pounding heart beat (palpitations).  You have severe headaches.  You develop vision problems.  You feel a lump in your breast.  You have abdominal pain or severe indigestion. This information is not intended to replace advice given to you by your health care provider. Make sure you discuss any questions you have with your health care provider. Document Released: 10/30/2003 Document Revised: 01/15/2016 Document Reviewed: 03/08/2013 Elsevier Interactive Patient Education  2017 Elsevier Inc.  
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
## 2018-03-01 NOTE — Progress Notes (Signed)
Pt stated having pelvic pain caused by fibroids x 1 month.

## 2018-03-06 ENCOUNTER — Encounter: Payer: Self-pay | Admitting: Obstetrics and Gynecology

## 2018-04-09 ENCOUNTER — Other Ambulatory Visit: Payer: Self-pay | Admitting: Internal Medicine

## 2018-04-09 DIAGNOSIS — F324 Major depressive disorder, single episode, in partial remission: Secondary | ICD-10-CM

## 2018-04-10 ENCOUNTER — Other Ambulatory Visit: Payer: Self-pay

## 2018-04-10 ENCOUNTER — Encounter
Admission: RE | Admit: 2018-04-10 | Discharge: 2018-04-10 | Disposition: A | Discharge status: Home / Self Care | Payer: 59 | Source: Ambulatory Visit | Attending: Obstetrics & Gynecology | Admitting: Obstetrics & Gynecology

## 2018-04-10 HISTORY — DX: Pneumonia, unspecified organism: J18.9

## 2018-04-10 HISTORY — DX: Gastro-esophageal reflux disease without esophagitis: K21.9

## 2018-04-10 HISTORY — DX: Cardiac arrhythmia, unspecified: I49.9

## 2018-04-10 HISTORY — DX: Other injury of unspecified body region, initial encounter: T14.8XXA

## 2018-04-10 HISTORY — DX: Wheezing: R06.2

## 2018-04-10 NOTE — Patient Instructions (Signed)
Your procedure is scheduled on: 04-17-18 MONDAY Report to Same Day Surgery 2nd floor medical mall [facility] Entrance-take elevator on left to 2nd floor.  Check in with surgery information desk.) To find out your arrival time please call [phone] between 1PM - 3PM on 04-14-18 FRIDAY  Remember: Instructions that are not followed completely may result in serious medical risk, up to and including death, or upon the discretion of your surgeon and anesthesiologist your surgery may need to be rescheduled.    _x___ 1. Do not eat food after midnight the night before your procedure. NO GUM OR CANDY AFTER MIDNIGHT.  You may drink clear liquids up to 2 hours before you are scheduled to arrive at the hospital for your procedure.  Do not drink clear liquids within 2 hours of your scheduled arrival to the hospital.  Clear liquids include  --Water or Apple juice without pulp  --Clear carbohydrate beverage such as ClearFast or Gatorade  --Black Coffee or Clear Tea (No milk, no creamers, do not add anything to the coffee or Tea   ____Ensure clear carbohydrate drink on the way to the hospital for bariatric patients  ____Ensure clear carbohydrate drink 3 hours before surgery for Dr Dwyane Luo patients if physician instructed.   No gum chewing or hard candies.     __x__ 2. No Alcohol for 24 hours before or after surgery.   __x__3. No Smoking or e-cigarettes for 24 prior to surgery.  Do not use any chewable tobacco products for at least 6 hour prior to surgery   ____  4. Bring all medications with you on the day of surgery if instructed.    __x__ 5. Notify your doctor if there is any change in your medical condition     (cold, fever, infections).    x___6. On the morning of surgery brush your teeth with toothpaste and water.  You may rinse your mouth with mouth wash if you wish.  Do not swallow any toothpaste or mouthwash.   Do not wear jewelry, make-up, hairpins, clips or nail polish.  Do not wear  lotions, powders, or perfumes. You may wear deodorant.  Do not shave 48 hours prior to surgery. Men may shave face and neck.  Do not bring valuables to the hospital.    [facility] is not responsible for any belongings or valuables.               Contacts, dentures or bridgework may not be worn into surgery.  Leave your suitcase in the car. After surgery it may be brought to your room.  For patients admitted to the hospital, discharge time is determined by your treatment team.  _  Patients discharged the day of surgery will not be allowed to drive home.  You will need someone to drive you home and stay with you the night of your procedure.    Please read over the following fact sheets that you were given:   [facility] Preparing for Surgery   _x___ TAKE THE FOLLOWING MEDICATION THE MORNING OF SURGERY WITH A SMALL SIP OF WATER. These include:  1. PRILOSEC (OMEPRAZOLE)  2. YOU MAY TAKE YOUR ATIVAN (LORAZEPAM) THE MORNING OF SURGERY IF NEEDED  3.  4.  5.  6.  ____Fleets enema or Magnesium Citrate as directed.   _x___ Use CHG Soap or sage wipes as directed on instruction sheet   _X___  Bring to hospital day of surgery-BRING YOUR ALBUTEROL [place]  ____ Stop Metformin and  Janumet 2 days prior to surgery.    ____ Take 1/2 of usual insulin dose the night before surgery and none on the morning surgery.   ____ Follow recommendations from Cardiologist, Pulmonologist or PCP regarding stopping Aspirin, Coumadin, Plavix ,Eliquis, Effient, or Pradaxa, and Pletal.  X____Stop Anti-inflammatories such as Advil, Aleve, Ibuprofen, Motrin, Naproxen,MELOXICAM (MOBIC) Naprosyn, Goodies powders or aspirin products NOW-OK to take Tylenol    _x___ Stop supplements until after surgery-STOP DEPRENYL, AIRBORNE, MELATONIN AND OIL OF OREGANO NOW-MAY RESUME AFTER SURGERY   ____ Bring C-Pap to the hospital.

## 2018-04-11 ENCOUNTER — Encounter
Admission: RE | Admit: 2018-04-11 | Discharge: 2018-04-11 | Disposition: A | Discharge status: Home / Self Care | Payer: 59 | Source: Ambulatory Visit | Attending: Obstetrics & Gynecology | Admitting: Obstetrics & Gynecology

## 2018-04-11 ENCOUNTER — Ambulatory Visit
Admission: RE | Admit: 2018-04-11 | Discharge: 2018-04-11 | Disposition: A | Discharge status: Home / Self Care | Payer: 59 | Source: Ambulatory Visit | Attending: Obstetrics & Gynecology | Admitting: Obstetrics & Gynecology

## 2018-04-11 DIAGNOSIS — R062 Wheezing: Secondary | ICD-10-CM

## 2018-04-11 DIAGNOSIS — D259 Leiomyoma of uterus, unspecified: Secondary | ICD-10-CM | POA: Insufficient documentation

## 2018-04-11 DIAGNOSIS — R102 Pelvic and perineal pain: Secondary | ICD-10-CM | POA: Diagnosis present

## 2018-04-11 LAB — BASIC METABOLIC PANEL
ANION GAP: 8 (ref 5–15)
BUN: 28 mg/dL — ABNORMAL HIGH (ref 6–20)
CALCIUM: 9.2 mg/dL (ref 8.9–10.3)
CO2: 29 mmol/L (ref 22–32)
CREATININE: 0.78 mg/dL (ref 0.44–1.00)
Chloride: 105 mmol/L (ref 98–111)
GFR calc non Af Amer: >60 mL/min (ref >60)
Glucose, Bld: 98 mg/dL (ref 70–99)
Potassium: 3.7 mmol/L (ref 3.5–5.1)
SODIUM: 142 mmol/L (ref 135–145)

## 2018-04-11 LAB — TYPE AND SCREEN
ABO/RH(D): O POS
ANTIBODY SCREEN: NEGATIVE

## 2018-04-11 LAB — CBC
HCT: 41 % (ref 35.0–47.0)
HEMOGLOBIN: 13.6 g/dL (ref 12.0–16.0)
MCH: 29.1 pg (ref 26.0–34.0)
MCHC: 33.2 g/dL (ref 32.0–36.0)
MCV: 87.6 fL (ref 80.0–100.0)
PLATELETS: 298 10*3/uL (ref 150–440)
RBC: 4.68 MIL/uL (ref 3.80–5.20)
RDW: 13.7 % (ref 11.5–14.5)
WBC: 8.4 10*3/uL (ref 3.6–11.0)

## 2018-04-11 NOTE — Pre-Procedure Instructions (Signed)
CALLED DR Ronelle Nigh REGARDING ABNORMAL CXR-PT STILL HAS OCC WHEEZING FROM PNEUMONIA IN 2018-STILL HAVING TO USE ALBUTEROL INHALER PRN- DR QASUORV WANTING MEDICAL CLEARANCE-  CALLED OVER TO DR WARDS OFFICE AND SPOKE WITH JESSICA AND INFORMED HER THAT I FAXED OVER MEDICAL CLEARANCE TO THEIR OFFICE AND TO DR Oneal Deputy OFFICE (PCP) WITH FAX CONFIRMATION RECEIVED FROM BOTH OFFICES. I TOLD JESSICA THAT DR WARDS NURSE NEEDS TO F/U WITH THIS CLEARANCE.

## 2018-04-14 ENCOUNTER — Encounter: Payer: Self-pay | Admitting: Internal Medicine

## 2018-04-14 ENCOUNTER — Ambulatory Visit (INDEPENDENT_AMBULATORY_CARE_PROVIDER_SITE_OTHER): Payer: 59 | Admitting: Internal Medicine

## 2018-04-14 VITALS — BP 104/70 | HR 80 | Ht 67.0 in | Wt 202.0 lb

## 2018-04-14 DIAGNOSIS — J3089 Other allergic rhinitis: Secondary | ICD-10-CM | POA: Diagnosis not present

## 2018-04-14 DIAGNOSIS — D259 Leiomyoma of uterus, unspecified: Secondary | ICD-10-CM

## 2018-04-14 NOTE — Pre-Procedure Instructions (Signed)
Glean Hess, MD  Physician  Internal Medicine  Progress Notes  Signed  Encounter Date:  04/14/2018          Signed      Expand All Collapse All    Show:Clear all [x] Manual[x] Template[] Copied  Added by: [x] Glean Hess, MD  [] Hover for details    Date:  04/14/2018   Name:  Casey Marquez                        DOB:  [birth year]                     MRN:  [id number]   Chief Complaint: Wheezing (pt has not been wheezing/ just experiencing seasonal allergies. )  Seasonal allergies - she uses inhaler - either dulera or albuterol as needed for dry cough and PND. She has flonase and zyrtec but does not take them regularly.   She has not had any wheezing.  Her CXR was done recently and was normal.  However, GYN wants her cleared by PCP for surgery on 04/17/18.  She is scheduled for hysterectomy - unsure about keeping her ovaries since she has has so much trouble balancing her hormones - she has read that it will be easier if she still has ovarian tissue.  Review of Systems  Constitutional: Negative for chills, fatigue and fever.  HENT: Positive for postnasal drip. Negative for sinus pressure, sore throat and trouble swallowing.   Eyes: Negative for visual disturbance.  Respiratory: Positive for cough. Negative for chest tightness, shortness of breath and wheezing.   Cardiovascular: Negative for chest pain and palpitations.  Genitourinary: Positive for menstrual problem and pelvic pain.  Neurological: Negative for dizziness and headaches.  Hematological: Negative for adenopathy.  Psychiatric/Behavioral: Negative for sleep disturbance.        Patient Active Problem List   Diagnosis Date Noted  . Bronchospasm 07/05/2017  . Gastroesophageal reflux disease 06/28/2017  . Allergic state 01/11/2015  . Depression, major, single episode, in partial remission ([place]) 01/11/2015  . Hot flash, menopausal 01/11/2015  . Nerve pain 01/11/2015         Allergies    Allergen Reactions  . Other     ALL OPIODS MAKE PT NAUSEATED  . Sertraline Nausea Only  . Tegretol  [Carbamazepine]     confusion         Past Surgical History:  Procedure Laterality Date  . EXCISIONAL HEMORRHOIDECTOMY  2002  . fibroidectomy  1990  . GANGLION CYST EXCISION  2007  . INGUINAL HERNIA REPAIR Left 2015  . TENDON REPAIR Right     Social History        Tobacco Use  . Smoking status: Never Smoker  . Smokeless tobacco: Never Used  Substance Use Topics  . Alcohol use: Yes    Alcohol/week: 0.0 standard drinks    Comment: EVERY OTHER DAY  . Drug use: No     Medication list has been reviewed and updated.  ActiveMedications      Current Meds  Medication Sig  . albuterol (PROVENTIL HFA;VENTOLIN HFA) 108 (90 Base) MCG/ACT inhaler 1-2 puffs every 4-6 hours prn wheezing or cough (Patient taking differently: Inhale 2 puffs into the lungs every 4 (four) hours as needed for wheezing or shortness of breath. )  . Amino Acids (TRIAMINO) TABS Take 2 tablets by mouth every evening.  . cetirizine (ZYRTEC) 10 MG tablet Take 10 mg by mouth daily as needed for allergies.  Marland Kitchen  Cholecalciferol (VITAMIN D3) 10000 units capsule Take 10,000 Units by mouth every evening.  Marland Kitchen Dextrose-Fructose-Sod Citrate (NAUZENE) [phone] MG CHEW Chew 1-2 tablets by mouth daily as needed (nausea).  Marland Kitchen FLUoxetine (PROZAC) 10 MG tablet TAKE 1 TABLET(10 MG) BY MOUTH DAILY (Patient taking differently: Take 10 mg by mouth every evening. )  . fluticasone (FLONASE) 50 MCG/ACT nasal spray Place 2 sprays into both nostrils daily as needed for allergies.   Marland Kitchen guaiFENesin (MUCINEX) 600 MG 12 hr tablet Take 600 mg by mouth daily as needed for cough.  Marland Kitchen ibuprofen (ADVIL,MOTRIN) 200 MG tablet Take 200 mg by mouth every evening.  Marland Kitchen ketotifen (ZADITOR) 0.025 % ophthalmic solution Place 1 drop into both eyes daily as needed (allergies).  . LORazepam (ATIVAN) 0.5 MG tablet Take 0.5 tablets (0.25  mg total) by mouth daily as needed for anxiety.  . Magnesium 400 MG TABS Take 400 mg by mouth every evening.  . Melatonin 5 MG TABS Take 5 mg by mouth at bedtime.  . meloxicam (MOBIC) 7.5 MG tablet Take 3.75 mg by mouth daily.  . Multiple Vitamins-Minerals (AIRBORNE) CHEW Chew 1 tablet by mouth daily as needed (immune support).  . OIL OF OREGANO PO Take 1 capsule by mouth daily as needed (immune support).  Marland Kitchen omeprazole (PRILOSEC) 20 MG capsule Take 1 capsule (20 mg total) by mouth daily. (Patient taking differently: Take 20 mg by mouth every evening. )  . OVER THE COUNTER MEDICATION Take 1 mL by mouth daily as needed (mood / memory). Deprenyl otc supplement      PHQ 2/9 Scores 01/31/2018 08/26/2017 05/13/2017  PHQ - 2 Score 0 4 2  PHQ- 9 Score 2 - 5  Exception Documentation - Patient refusal Other- indicate reason in comment box  Not completed - - stir crazy but nothing out of the ordinary - per patient    Physical Exam  Constitutional: She is oriented to person, place, and time. She appears well-developed. No distress.  HENT:  Head: Normocephalic and atraumatic.  Cardiovascular: Normal rate, regular rhythm and normal heart sounds.  Pulmonary/Chest: Effort normal and breath sounds normal. No respiratory distress. She has no wheezes. She exhibits no tenderness.  Musculoskeletal: Normal range of motion.  Neurological: She is alert and oriented to person, place, and time.  Skin: Skin is warm and dry. No rash noted.  Psychiatric: She has a normal mood and affect. Her behavior is normal. Thought content normal.  Nursing note and vitals reviewed.   BP 104/70   Pulse 80   Ht 5\' 7"  (1.702 m)   Wt 202 lb (91.6 kg)   LMP 04/10/2008   SpO2 98%   BMI 31.64 kg/m   Assessment and Plan: 1. Environmental and seasonal allergies Take Zyrtec, Flonase and Mucinex  2. Uterine leiomyoma, unspecified location Surgical clearance form will be completed.   No orders of the defined types  were placed in this encounter.   Partially dictated using Editor, commissioning. Any errors are unintentional.  Halina Maidens, MD [facility]  04/14/2018           Electronically signed by Glean Hess, MD at 04/14/2018 12:14 PM     Office Visit on 04/14/2018       Detailed Report

## 2018-04-14 NOTE — Patient Instructions (Addendum)
Begin flonase nasal spray, Zyrtec, and Mucinex

## 2018-04-14 NOTE — Progress Notes (Signed)
Date:  04/14/2018   Name:  Casey Marquez   DOB:  Jan 01, 1961   MRN:  [id number]   Chief Complaint: Wheezing (pt has not been wheezing/ just experiencing seasonal allergies. )  Seasonal allergies - she uses inhaler - either dulera or albuterol as needed for dry cough and PND. She has flonase and zyrtec but does not take them regularly.   She has not had any wheezing.  Her CXR was done recently and was normal.  However, GYN wants her cleared by PCP for surgery on 04/17/18.  She is scheduled for hysterectomy - unsure about keeping her ovaries since she has has so much trouble balancing her hormones - she has read that it will be easier if she still has ovarian tissue.  Review of Systems  Constitutional: Negative for chills, fatigue and fever.  HENT: Positive for postnasal drip. Negative for sinus pressure, sore throat and trouble swallowing.   Eyes: Negative for visual disturbance.  Respiratory: Positive for cough. Negative for chest tightness, shortness of breath and wheezing.   Cardiovascular: Negative for chest pain and palpitations.  Genitourinary: Positive for menstrual problem and pelvic pain.  Neurological: Negative for dizziness and headaches.  Hematological: Negative for adenopathy.  Psychiatric/Behavioral: Negative for sleep disturbance.    Patient Active Problem List   Diagnosis Date Noted  . Bronchospasm 07/05/2017  . Gastroesophageal reflux disease 06/28/2017  . Allergic state 01/11/2015  . Depression, major, single episode, in partial remission ([place]) 01/11/2015  . Hot flash, menopausal 01/11/2015  . Nerve pain 01/11/2015    Allergies  Allergen Reactions  . Other     ALL OPIODS MAKE PT NAUSEATED  . Sertraline Nausea Only  . Tegretol  [Carbamazepine]     confusion    Past Surgical History:  Procedure Laterality Date  . EXCISIONAL HEMORRHOIDECTOMY  2002  . fibroidectomy  1990  . GANGLION CYST EXCISION  2007  . INGUINAL HERNIA REPAIR Left 2015  . TENDON  REPAIR Right     Social History   Tobacco Use  . Smoking status: Never Smoker  . Smokeless tobacco: Never Used  Substance Use Topics  . Alcohol use: Yes    Alcohol/week: 0.0 standard drinks    Comment: EVERY OTHER DAY  . Drug use: No     Medication list has been reviewed and updated.  Current Meds  Medication Sig  . albuterol (PROVENTIL HFA;VENTOLIN HFA) 108 (90 Base) MCG/ACT inhaler 1-2 puffs every 4-6 hours prn wheezing or cough (Patient taking differently: Inhale 2 puffs into the lungs every 4 (four) hours as needed for wheezing or shortness of breath. )  . Amino Acids (TRIAMINO) TABS Take 2 tablets by mouth every evening.  . cetirizine (ZYRTEC) 10 MG tablet Take 10 mg by mouth daily as needed for allergies.  . Cholecalciferol (VITAMIN D3) 10000 units capsule Take 10,000 Units by mouth every evening.  Marland Kitchen Dextrose-Fructose-Sod Citrate (NAUZENE) [phone] MG CHEW Chew 1-2 tablets by mouth daily as needed (nausea).  Marland Kitchen FLUoxetine (PROZAC) 10 MG tablet TAKE 1 TABLET(10 MG) BY MOUTH DAILY (Patient taking differently: Take 10 mg by mouth every evening. )  . fluticasone (FLONASE) 50 MCG/ACT nasal spray Place 2 sprays into both nostrils daily as needed for allergies.   Marland Kitchen guaiFENesin (MUCINEX) 600 MG 12 hr tablet Take 600 mg by mouth daily as needed for cough.  Marland Kitchen ibuprofen (ADVIL,MOTRIN) 200 MG tablet Take 200 mg by mouth every evening.  Marland Kitchen ketotifen (ZADITOR) 0.025 % ophthalmic solution Place 1 drop  into both eyes daily as needed (allergies).  . LORazepam (ATIVAN) 0.5 MG tablet Take 0.5 tablets (0.25 mg total) by mouth daily as needed for anxiety.  . Magnesium 400 MG TABS Take 400 mg by mouth every evening.  . Melatonin 5 MG TABS Take 5 mg by mouth at bedtime.  . meloxicam (MOBIC) 7.5 MG tablet Take 3.75 mg by mouth daily.  . Multiple Vitamins-Minerals (AIRBORNE) CHEW Chew 1 tablet by mouth daily as needed (immune support).  . OIL OF OREGANO PO Take 1 capsule by mouth daily as needed  (immune support).  Marland Kitchen omeprazole (PRILOSEC) 20 MG capsule Take 1 capsule (20 mg total) by mouth daily. (Patient taking differently: Take 20 mg by mouth every evening. )  . OVER THE COUNTER MEDICATION Take 1 mL by mouth daily as needed (mood / memory). Deprenyl otc supplement    PHQ 2/9 Scores 01/31/2018 08/26/2017 05/13/2017  PHQ - 2 Score 0 4 2  PHQ- 9 Score 2 - 5  Exception Documentation - Patient refusal Other- indicate reason in comment box  Not completed - - stir crazy but nothing out of the ordinary - per patient    Physical Exam  Constitutional: She is oriented to person, place, and time. She appears well-developed. No distress.  HENT:  Head: Normocephalic and atraumatic.  Cardiovascular: Normal rate, regular rhythm and normal heart sounds.  Pulmonary/Chest: Effort normal and breath sounds normal. No respiratory distress. She has no wheezes. She exhibits no tenderness.  Musculoskeletal: Normal range of motion.  Neurological: She is alert and oriented to person, place, and time.  Skin: Skin is warm and dry. No rash noted.  Psychiatric: She has a normal mood and affect. Her behavior is normal. Thought content normal.  Nursing note and vitals reviewed.   BP 104/70   Pulse 80   Ht 5\' 7"  (1.702 m)   Wt 202 lb (91.6 kg)   LMP 04/10/2008   SpO2 98%   BMI 31.64 kg/m   Assessment and Plan: 1. Environmental and seasonal allergies Take Zyrtec, Flonase and Mucinex  2. Uterine leiomyoma, unspecified location Surgical clearance form will be completed.   No orders of the defined types were placed in this encounter.   Partially dictated using Editor, commissioning. Any errors are unintentional.  Halina Maidens, MD [facility]  04/14/2018

## 2018-04-14 NOTE — Pre-Procedure Instructions (Signed)
Dr. Guido Sander nurse called to let PAT know that medical clearance has been received from her PCP and that they are faxing a copy to PAT as well.

## 2018-04-16 MED ORDER — CEFAZOLIN SODIUM-DEXTROSE 2-4 GM/100ML-% IV SOLN
2.0000 g | Freq: Once | INTRAVENOUS | Status: AC
  Administered 2018-04-17: 2 g via INTRAVENOUS

## 2018-04-17 ENCOUNTER — Other Ambulatory Visit: Payer: Self-pay

## 2018-04-17 ENCOUNTER — Ambulatory Visit
Admission: RE | Admit: 2018-04-17 | Discharge: 2018-04-17 | Disposition: A | Discharge status: Home / Self Care | Payer: 59 | Source: Ambulatory Visit | Attending: Obstetrics & Gynecology | Admitting: Obstetrics & Gynecology

## 2018-04-17 ENCOUNTER — Ambulatory Visit: Payer: 59 | Admitting: Certified Registered Nurse Anesthetist

## 2018-04-17 ENCOUNTER — Encounter
Admission: RE | Disposition: A | Discharge status: Home / Self Care | Payer: Self-pay | Source: Ambulatory Visit | Attending: Obstetrics & Gynecology

## 2018-04-17 DIAGNOSIS — F419 Anxiety disorder, unspecified: Secondary | ICD-10-CM | POA: Diagnosis not present

## 2018-04-17 DIAGNOSIS — D251 Intramural leiomyoma of uterus: Secondary | ICD-10-CM | POA: Insufficient documentation

## 2018-04-17 DIAGNOSIS — N840 Polyp of corpus uteri: Secondary | ICD-10-CM | POA: Diagnosis not present

## 2018-04-17 DIAGNOSIS — Z79899 Other long term (current) drug therapy: Secondary | ICD-10-CM | POA: Diagnosis not present

## 2018-04-17 DIAGNOSIS — Z888 Allergy status to other drugs, medicaments and biological substances status: Secondary | ICD-10-CM | POA: Diagnosis not present

## 2018-04-17 DIAGNOSIS — K219 Gastro-esophageal reflux disease without esophagitis: Secondary | ICD-10-CM | POA: Diagnosis not present

## 2018-04-17 DIAGNOSIS — D259 Leiomyoma of uterus, unspecified: Secondary | ICD-10-CM | POA: Diagnosis present

## 2018-04-17 DIAGNOSIS — N72 Inflammatory disease of cervix uteri: Secondary | ICD-10-CM | POA: Insufficient documentation

## 2018-04-17 DIAGNOSIS — F329 Major depressive disorder, single episode, unspecified: Secondary | ICD-10-CM | POA: Diagnosis not present

## 2018-04-17 DIAGNOSIS — Z8249 Family history of ischemic heart disease and other diseases of the circulatory system: Secondary | ICD-10-CM | POA: Diagnosis not present

## 2018-04-17 HISTORY — PX: LAPAROSCOPIC HYSTERECTOMY: SHX1926

## 2018-04-17 HISTORY — PX: LAPAROSCOPIC BILATERAL SALPINGO OOPHERECTOMY: SHX5890

## 2018-04-17 LAB — ABO/RH: ABO/RH(D): O POS

## 2018-04-17 SURGERY — HYSTERECTOMY, TOTAL, LAPAROSCOPIC
Anesthesia: General | Wound class: Clean Contaminated

## 2018-04-17 MED ORDER — DEXAMETHASONE SODIUM PHOSPHATE 10 MG/ML IJ SOLN
INTRAMUSCULAR | Status: DC | PRN
  Administered 2018-04-17: 6 mg via INTRAVENOUS

## 2018-04-17 MED ORDER — OXYCODONE HCL 5 MG/5ML PO SOLN: 5.0000 mg | Freq: Once | ORAL | Status: DC | PRN

## 2018-04-17 MED ORDER — PROPOFOL 10 MG/ML IV BOLUS
INTRAVENOUS | Status: DC | PRN
  Administered 2018-04-17: 200 mg via INTRAVENOUS

## 2018-04-17 MED ORDER — LACTATED RINGERS IV SOLN
INTRAVENOUS | Status: DC
  Administered 2018-04-17: 10:00:00 via INTRAVENOUS

## 2018-04-17 MED ORDER — FENTANYL CITRATE (PF) 100 MCG/2ML IJ SOLN
INTRAMUSCULAR | Status: DC | PRN
  Administered 2018-04-17 (×4): 50 ug via INTRAVENOUS

## 2018-04-17 MED ORDER — MIDAZOLAM HCL 2 MG/2ML IJ SOLN
INTRAMUSCULAR | Status: DC | PRN
  Administered 2018-04-17: 2 mg via INTRAVENOUS

## 2018-04-17 MED ORDER — CELECOXIB 200 MG PO CAPS
ORAL_CAPSULE | ORAL | Status: AC
  Filled 2018-04-17: qty 2

## 2018-04-17 MED ORDER — SCOPOLAMINE 1 MG/3DAYS TD PT72
1.0000 | MEDICATED_PATCH | TRANSDERMAL | Status: DC
  Administered 2018-04-17: 1.5 mg via TRANSDERMAL

## 2018-04-17 MED ORDER — PHENYLEPHRINE HCL 10 MG/ML IJ SOLN
INTRAMUSCULAR | Status: DC | PRN
  Administered 2018-04-17 (×2): 50 ug via INTRAVENOUS

## 2018-04-17 MED ORDER — OXYCODONE HCL 5 MG PO TABS: 5.0000 mg | ORAL_TABLET | ORAL | 0 refills | Status: DC | PRN

## 2018-04-17 MED ORDER — KETAMINE HCL 50 MG/ML IJ SOLN
INTRAMUSCULAR | Status: AC
  Filled 2018-04-17: qty 10

## 2018-04-17 MED ORDER — LIDOCAINE HCL (PF) 2 % IJ SOLN
INTRAMUSCULAR | Status: AC
  Filled 2018-04-17: qty 10

## 2018-04-17 MED ORDER — ACETAMINOPHEN 500 MG PO TABS
1000.0000 mg | ORAL_TABLET | Freq: Once | ORAL | Status: AC
  Administered 2018-04-17: 1000 mg via ORAL

## 2018-04-17 MED ORDER — KETOROLAC TROMETHAMINE 30 MG/ML IJ SOLN
INTRAMUSCULAR | Status: DC | PRN
  Administered 2018-04-17: 30 mg via INTRAVENOUS

## 2018-04-17 MED ORDER — LIDOCAINE HCL (CARDIAC) PF 100 MG/5ML IV SOSY
PREFILLED_SYRINGE | INTRAVENOUS | Status: DC | PRN
  Administered 2018-04-17: 60 mg via INTRAVENOUS

## 2018-04-17 MED ORDER — KETOROLAC TROMETHAMINE 30 MG/ML IJ SOLN
INTRAMUSCULAR | Status: AC
  Filled 2018-04-17: qty 1

## 2018-04-17 MED ORDER — SODIUM CHLORIDE FLUSH 0.9 % IV SOLN
INTRAVENOUS | Status: AC
  Filled 2018-04-17: qty 10

## 2018-04-17 MED ORDER — FENTANYL CITRATE (PF) 100 MCG/2ML IJ SOLN
INTRAMUSCULAR | Status: AC
  Filled 2018-04-17: qty 2

## 2018-04-17 MED ORDER — MIDAZOLAM HCL 2 MG/2ML IJ SOLN
INTRAMUSCULAR | Status: AC
  Filled 2018-04-17: qty 2

## 2018-04-17 MED ORDER — FENTANYL CITRATE (PF) 100 MCG/2ML IJ SOLN
25.0000 ug | INTRAMUSCULAR | Status: DC | PRN
  Administered 2018-04-17: 50 ug via INTRAVENOUS

## 2018-04-17 MED ORDER — ONDANSETRON HCL 4 MG/2ML IJ SOLN
INTRAMUSCULAR | Status: DC | PRN
  Administered 2018-04-17: 4 mg via INTRAVENOUS

## 2018-04-17 MED ORDER — KETAMINE HCL 50 MG/ML IJ SOLN
INTRAMUSCULAR | Status: DC | PRN
  Administered 2018-04-17: 50 mg via INTRAMUSCULAR

## 2018-04-17 MED ORDER — SUGAMMADEX SODIUM 200 MG/2ML IV SOLN
INTRAVENOUS | Status: DC | PRN
  Administered 2018-04-17: 183.2 mg via INTRAVENOUS

## 2018-04-17 MED ORDER — GLYCOPYRROLATE 0.2 MG/ML IJ SOLN
INTRAMUSCULAR | Status: DC | PRN
  Administered 2018-04-17: 0.2 mg via INTRAVENOUS

## 2018-04-17 MED ORDER — HEPARIN SODIUM (PORCINE) 5000 UNIT/ML IJ SOLN
5000.0000 [IU] | Freq: Once | INTRAMUSCULAR | Status: AC
  Administered 2018-04-17: 5000 [IU] via SUBCUTANEOUS

## 2018-04-17 MED ORDER — ACETAMINOPHEN 500 MG PO TABS
ORAL_TABLET | ORAL | Status: AC
  Administered 2018-04-17: 1000 mg via ORAL
  Filled 2018-04-17: qty 2

## 2018-04-17 MED ORDER — GABAPENTIN 300 MG PO CAPS
ORAL_CAPSULE | ORAL | Status: AC
  Filled 2018-04-17: qty 2

## 2018-04-17 MED ORDER — PROPOFOL 10 MG/ML IV BOLUS
INTRAVENOUS | Status: AC
  Filled 2018-04-17: qty 20

## 2018-04-17 MED ORDER — SUCCINYLCHOLINE CHLORIDE 20 MG/ML IJ SOLN
INTRAMUSCULAR | Status: DC | PRN
  Administered 2018-04-17: 120 mg via INTRAVENOUS

## 2018-04-17 MED ORDER — ONDANSETRON HCL 4 MG/2ML IJ SOLN
INTRAMUSCULAR | Status: AC
  Filled 2018-04-17: qty 2

## 2018-04-17 MED ORDER — SCOPOLAMINE 1 MG/3DAYS TD PT72
MEDICATED_PATCH | TRANSDERMAL | Status: AC
  Administered 2018-04-17: 1.5 mg via TRANSDERMAL
  Filled 2018-04-17: qty 1

## 2018-04-17 MED ORDER — ROCURONIUM BROMIDE 100 MG/10ML IV SOLN
INTRAVENOUS | Status: DC | PRN
  Administered 2018-04-17: 25 mg via INTRAVENOUS
  Administered 2018-04-17: 5 mg via INTRAVENOUS
  Administered 2018-04-17: 20 mg via INTRAVENOUS

## 2018-04-17 MED ORDER — CELECOXIB 200 MG PO CAPS
400.0000 mg | ORAL_CAPSULE | Freq: Once | ORAL | Status: AC
  Administered 2018-04-17: 400 mg via ORAL

## 2018-04-17 MED ORDER — GABAPENTIN 300 MG PO CAPS: 600.0000 mg | ORAL_CAPSULE | Freq: Once | ORAL | Status: DC

## 2018-04-17 MED ORDER — PROMETHAZINE HCL 25 MG/ML IJ SOLN
6.2500 mg | INTRAMUSCULAR | Status: DC | PRN
  Administered 2018-04-17: 6.25 mg via INTRAVENOUS

## 2018-04-17 MED ORDER — SUCCINYLCHOLINE CHLORIDE 20 MG/ML IJ SOLN
INTRAMUSCULAR | Status: AC
  Filled 2018-04-17: qty 1

## 2018-04-17 MED ORDER — DEXAMETHASONE SODIUM PHOSPHATE 10 MG/ML IJ SOLN
INTRAMUSCULAR | Status: AC
  Filled 2018-04-17: qty 1

## 2018-04-17 MED ORDER — OXYCODONE HCL 5 MG PO TABS: 5.0000 mg | ORAL_TABLET | Freq: Once | ORAL | Status: DC | PRN

## 2018-04-17 MED ORDER — GABAPENTIN 300 MG PO CAPS
600.0000 mg | ORAL_CAPSULE | Freq: Once | ORAL | Status: AC
  Administered 2018-04-17: 600 mg via ORAL

## 2018-04-17 MED ORDER — IBUPROFEN 800 MG PO TABS: 800.0000 mg | ORAL_TABLET | Freq: Four times a day (QID) | ORAL | 1 refills | Status: DC

## 2018-04-17 MED ORDER — PROMETHAZINE HCL 25 MG/ML IJ SOLN
INTRAMUSCULAR | Status: AC
  Administered 2018-04-17: 6.25 mg via INTRAVENOUS
  Filled 2018-04-17: qty 1

## 2018-04-17 MED ORDER — HEPARIN SODIUM (PORCINE) 5000 UNIT/ML IJ SOLN
INTRAMUSCULAR | Status: AC
  Administered 2018-04-17: 5000 [IU] via SUBCUTANEOUS
  Filled 2018-04-17: qty 1

## 2018-04-17 MED ORDER — ROCURONIUM BROMIDE 50 MG/5ML IV SOLN
INTRAVENOUS | Status: AC
  Filled 2018-04-17: qty 1

## 2018-04-17 MED ORDER — FENTANYL CITRATE (PF) 100 MCG/2ML IJ SOLN
INTRAMUSCULAR | Status: AC
  Administered 2018-04-17: 50 ug via INTRAVENOUS
  Filled 2018-04-17: qty 2

## 2018-04-17 SURGICAL SUPPLY — 60 items
BACTOSHIELD CHG 4% 4OZ (MISCELLANEOUS) ×1
BAG URINE DRAINAGE (UROLOGICAL SUPPLIES) ×4 IMPLANT
BLADE SURG SZ11 CARB STEEL (BLADE) ×4 IMPLANT
CANISTER SUCT 1200ML W/VALVE (MISCELLANEOUS) ×4 IMPLANT
CATH FOLEY 2WAY  5CC 16FR (CATHETERS) ×2
CATH URTH 16FR FL 2W BLN LF (CATHETERS) ×2 IMPLANT
CHLORAPREP W/TINT 26ML (MISCELLANEOUS) ×8 IMPLANT
DEFOGGER SCOPE WARMER CLEARIFY (MISCELLANEOUS) ×4 IMPLANT
DERMABOND ADVANCED (GAUZE/BANDAGES/DRESSINGS) ×2
DERMABOND ADVANCED .7 DNX12 (GAUZE/BANDAGES/DRESSINGS) ×2 IMPLANT
DEVICE SUTURE ENDOST 10MM (ENDOMECHANICALS) IMPLANT
DRAPE LEGGINS SURG 28X43 STRL (DRAPES) ×4 IMPLANT
DRAPE SHEET LG 3/4 BI-LAMINATE (DRAPES) ×4 IMPLANT
DRAPE UNDER BUTTOCK W/FLU (DRAPES) ×4 IMPLANT
DRSG TELFA 4X3 1S NADH ST (GAUZE/BANDAGES/DRESSINGS) IMPLANT
ELECT REM PT RETURN 9FT ADLT (ELECTROSURGICAL) ×4
ELECTRODE REM PT RTRN 9FT ADLT (ELECTROSURGICAL) ×2 IMPLANT
GLOVE PI ORTHOPRO 6.5 (GLOVE) ×2
GLOVE PI ORTHOPRO STRL 6.5 (GLOVE) ×2 IMPLANT
GLOVE SURG SYN 6.5 ES PF (GLOVE) ×12 IMPLANT
GOWN STRL REUS W/ TWL LRG LVL3 (GOWN DISPOSABLE) ×6 IMPLANT
GOWN STRL REUS W/ TWL XL LVL3 (GOWN DISPOSABLE) ×2 IMPLANT
GOWN STRL REUS W/TWL LRG LVL3 (GOWN DISPOSABLE) ×6
GOWN STRL REUS W/TWL XL LVL3 (GOWN DISPOSABLE) ×2
GRASPER SUT TROCAR 14GX15 (MISCELLANEOUS) IMPLANT
IRRIGATION STRYKERFLOW (MISCELLANEOUS) ×2 IMPLANT
IRRIGATOR STRYKERFLOW (MISCELLANEOUS) ×4
IV LACTATED RINGERS 1000ML (IV SOLUTION) IMPLANT
KIT PINK PAD W/HEAD ARE REST (MISCELLANEOUS) ×4
KIT PINK PAD W/HEAD ARM REST (MISCELLANEOUS) ×2 IMPLANT
KIT TURNOVER CYSTO (KITS) ×4 IMPLANT
L-HOOK LAP DISP 36CM (ELECTROSURGICAL)
LABEL OR SOLS (LABEL) ×4 IMPLANT
LHOOK LAP DISP 36CM (ELECTROSURGICAL) IMPLANT
LIGASURE VESSEL 5MM BLUNT TIP (ELECTROSURGICAL) IMPLANT
MANIPULATOR VCARE LG CRV RETR (MISCELLANEOUS) IMPLANT
MANIPULATOR VCARE SML CRV RETR (MISCELLANEOUS) IMPLANT
MANIPULATOR VCARE STD CRV RETR (MISCELLANEOUS) IMPLANT
NS IRRIG 500ML POUR BTL (IV SOLUTION) ×4 IMPLANT
PACK LAP CHOLECYSTECTOMY (MISCELLANEOUS) ×4 IMPLANT
PAD OB MATERNITY 4.3X12.25 (PERSONAL CARE ITEMS) ×4 IMPLANT
PAD PREP 24X41 OB/GYN DISP (PERSONAL CARE ITEMS) ×4 IMPLANT
PENCIL ELECTRO HAND CTR (MISCELLANEOUS) ×4 IMPLANT
POUCH SPECIMEN RETRIEVAL 10MM (ENDOMECHANICALS) ×4 IMPLANT
SCRUB CHG 4% DYNA-HEX 4OZ (MISCELLANEOUS) ×3 IMPLANT
SET CYSTO W/LG BORE CLAMP LF (SET/KITS/TRAYS/PACK) IMPLANT
SET YANKAUER POOLE SUCT (MISCELLANEOUS) ×4 IMPLANT
SLEEVE ENDOPATH XCEL 5M (ENDOMECHANICALS) ×8 IMPLANT
STRAP SAFETY 5IN WIDE (MISCELLANEOUS) ×4 IMPLANT
SURGILUBE 2OZ TUBE FLIPTOP (MISCELLANEOUS) ×4 IMPLANT
SUT ENDO VLOC 180-0-8IN (SUTURE) IMPLANT
SUT MNCRL 4-0 (SUTURE) ×2
SUT MNCRL 4-0 27XMFL (SUTURE) ×2
SUT VIC AB 0 CT1 36 (SUTURE) ×8 IMPLANT
SUTURE MNCRL 4-0 27XMF (SUTURE) ×2 IMPLANT
SYR 10ML LL (SYRINGE) ×4 IMPLANT
TROCAR ENDO BLADELESS 11MM (ENDOMECHANICALS) IMPLANT
TROCAR XCEL NON-BLD 5MMX100MML (ENDOMECHANICALS) ×4 IMPLANT
TUBING INSUF HEATED (TUBING) ×4 IMPLANT
TUBING INSUFFLATION (TUBING) ×4 IMPLANT

## 2018-04-17 NOTE — Discharge Instructions (Addendum)
Discharge instructions:  Call office if you have any of the following: fever >101 F, chills, shortness of breath, excessive vaginal bleeding, incision drainage or problems, leg pain or redness, or any other concerns.   Activity: Do not lift > 10 lbs for 8 weeks.  No intercourse or tampons for 8 weeks.  No driving for 1-2 weeks.   You may feel some pain in your upper right abdomen/rib and right shoulder.  This is from the gas in the abdomen for surgery. This will subside over time, please be patient!  Take 800mg  Ibuprofen and 1000mg  Tylenol around the clock, every 6 hours for at least the first 3-5 days.  After this you can take as needed.  This will help decrease inflammation and promote healing.  The narcotics you'll take just as needed, as they just trick your brain into thinking its not in pain.    Please don't limit yourself in terms of routine activity.  You will be able to do most things, although they may take longer to do or be a little painful.  You can do it!  Don't be a hero, but don't be a wimp either!    AMBULATORY SURGERY  DISCHARGE INSTRUCTIONS   1) The drugs that you were given will stay in your system until tomorrow so for the next 24 hours you should not:  A) Drive an automobile B) Make any legal decisions C) Drink any alcoholic beverage   2) You may resume regular meals tomorrow.  Today it is better to start with liquids and gradually work up to solid foods.  You may eat anything you prefer, but it is better to start with liquids, then soup and crackers, and gradually work up to solid foods.   3) Please notify your doctor immediately if you have any unusual bleeding, trouble breathing, redness and pain at the surgery site, drainage, fever, or pain not relieved by medication.    4) Additional Instructions:        Please contact your physician with any problems or Same Day Surgery at [phone], Monday through Friday 6 am to 4 pm, or [place] at  [facility] number at [phone].
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
## 2018-04-17 NOTE — Anesthesia Post-op Follow-up Note (Signed)
Anesthesia QCDR form completed.        

## 2018-04-17 NOTE — Transfer of Care (Signed)
Immediate Anesthesia Transfer of Care Note  Patient: Casey Marquez  Procedure(s) Performed: HYSTERECTOMY TOTAL LAPAROSCOPIC (N/A ) LAPAROSCOPIC BILATERAL SALPINGO OOPHORECTOMY (Bilateral )  Patient Location: PACU  Anesthesia Type:General  Level of Consciousness: sedated  Airway & Oxygen Therapy: Patient Spontanous Breathing and Patient connected to face mask oxygen  Post-op Assessment: Report given to RN and Post -op Vital signs reviewed and stable  Post vital signs: Reviewed and stable  Last Vitals:  Vitals Value Taken Time  BP 148/82 04/17/2018  1:36 PM  Temp 36.3 C 04/17/2018  1:36 PM  Pulse 63 04/17/2018  1:38 PM  Resp 15 04/17/2018  1:38 PM  SpO2 100 % 04/17/2018  1:38 PM  Vitals shown include unvalidated device data.  Last Pain:  Vitals:   04/17/18 0925  TempSrc: Temporal  PainSc: 0-No pain         Complications: No apparent anesthesia complications

## 2018-04-17 NOTE — Anesthesia Procedure Notes (Signed)
Procedure Name: Intubation Date/Time: 04/17/2018 11:40 AM Performed by: Dionne Bucy, CRNA Pre-anesthesia Checklist: Patient identified, Patient being monitored, Timeout performed, Emergency Drugs available and Suction available Patient Re-evaluated:Patient Re-evaluated prior to induction Oxygen Delivery Method: Circle system utilized Preoxygenation: Pre-oxygenation with 100% oxygen Induction Type: IV induction Ventilation: Mask ventilation without difficulty Laryngoscope Size: 3 and McGraph Grade View: Grade I Tube type: Oral Tube size: 7.0 mm Number of attempts: 1 Airway Equipment and Method: Stylet Placement Confirmation: ETT inserted through vocal cords under direct vision,  positive ETCO2 and breath sounds checked- equal and bilateral Secured at: 21 cm Tube secured with: Tape Dental Injury: Teeth and Oropharynx as per pre-operative assessment
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
## 2018-04-17 NOTE — OR Nursing (Signed)
Patient requests nausea med, "I will need it with the pain medicine".  Discussed via tele with Dr. Leonides Schanz, advises she will add zofran to patient's e-script.  Patient notified of same.

## 2018-04-17 NOTE — Op Note (Signed)
Total Laparoscopic Hysterectomy Operative Note Procedure Date: 04/17/2018  Patient:  Casey Marquez  57 y.o. female  PRE-OPERATIVE DIAGNOSIS:  fibroid uterus, pelvic pain  POST-OPERATIVE DIAGNOSIS:  fibroid uterus, pelvic pain  PROCEDURE:  Procedure(s): HYSTERECTOMY TOTAL LAPAROSCOPIC (N/A) LAPAROSCOPIC BILATERAL SALPINGO OOPHORECTOMY (Bilateral)  SURGEON:  Surgeon(s) and Role:    * Shaynna Husby, Honor Loh, MD - Primary    * Benjaman Kindler, MD - Assisting  ANESTHESIA:  General via ET  I/O  Total I/O In: 800 [I.V.:800] Out: 175 [Urine:150; Blood:25]  FINDINGS:  Small mobile uterus with anterior fibroid, normal ovaries and fallopian tubes bilaterally, prominent vasculature in the adnexa.  Adhesions of the sigmoid colon to the pelvic brim.  Normal upper abdomen.  SPECIMEN: Uterus, Cervix, and bilateral fallopian tubes  COMPLICATIONS: none apparent  DISPOSITION: vital signs stable to PACU  Indication for Surgery: 57 y.o. with fibroid uterus and pelvic pain.  Risks of surgery were discussed with the patient including but not limited to: bleeding which may require transfusion or reoperation; infection which may require antibiotics; injury to bowel, bladder, ureters or other surrounding organs; need for additional procedures including laparotomy, blood clot, incisional problems and other postoperative/anesthesia complications. Written informed consent was obtained.      PROCEDURE IN DETAIL:  The patient had 5000u Heparin Sub-q and sequential compression devices applied to her lower extremities while in the preoperative area.  She was then taken to the operating room. IV antibiotics were given. General anesthesia was administered via endotracheal route.  She was placed in the dorsal lithotomy position, and was prepped and draped in a sterile manner. A surgical time-out was performed.  A Foley catheter was inserted into her bladder and attached to constant drainage and a V-Care uterine  manipulator was then advanced into the uterus and a good fit around the cervix was noted. The gloves were changed, and attention was turned to the abdomen where an umbilical incision was made with the scalpel.  A 45mm trochar was inserted in the umbilical incision using a visiport method.Opening pressure was 43mmHg, and the abdomen was insufflated to 14mmHg carbon dioxide gas and adequate pneumoperitoneum was obtained. A survey of the patient's pelvis and abdomen revealed the findings as mentioned above. Two 16mm ports were inserted in the lower left and right quadrants under visualization.  The pressure was dropped to 48mmHg.  The adhesions of the sigmoid colon were dissected with the bovie hook so the colon could be pushed away from the operative field.  The bilateral fallopian tubes were separated from the mesosalpinx using the Ligasure. The bilateral round ligaments were transected and anterior broad ligament divided and brought across the uterus to separate the vesicouterine peritoneum and create a bladder flap. The bladder was pushed away from the uterus. The bilateral uterine arteries were skeletonized, ligated and transected. The bilateral uterosacral and cardinal ligaments were ligated and transected. A colpotomy was made around the V-Care cervical cup and the uterus, cervix, and bilateral tubes were removed through the vagina. The vaginal cuff was closed vaginally using 0-Vicryl in a running stitch. This was tested for integrity using the surgeon's finger. After a change of gloves, the pneumoperitoneum was recreated and surgical site inspected, and found to be hemostatic. Bilateral ureters were visualized vermiuclating. No intraoperative injury to surrounding organs was noted. The abdomen was desufflated and all instruments were then removed.   All skin incisions were closed with 4-0 monocryl and covered with surgical glue. The patient tolerated the procedures well.  All instruments, needles,  and sponge counts were correct x 2. The patient was taken to the recovery room in stable condition.   ---- Larey Days, MD Attending Obstetrician and [facility]

## 2018-04-17 NOTE — Transfer of Care (Deleted)
Immediate Anesthesia Transfer of Care Note  Patient: Casey Marquez  Procedure(s) Performed: HYSTERECTOMY TOTAL LAPAROSCOPIC (N/A ) LAPAROSCOPIC BILATERAL SALPINGO OOPHORECTOMY (Bilateral )  Patient Location: PACU  Anesthesia Type:General  Level of Consciousness: sedated  Airway & Oxygen Therapy: Patient Spontanous Breathing and Patient connected to face mask oxygen  Post-op Assessment: Report given to RN and Post -op Vital signs reviewed and stable  Post vital signs: Reviewed and stable  Last Vitals:  Vitals Value Taken Time  BP    Temp    Pulse    Resp    SpO2      Last Pain:  Vitals:   04/17/18 0925  TempSrc: Temporal  PainSc: 0-No pain         Complications: No apparent anesthesia complications

## 2018-04-18 ENCOUNTER — Encounter: Payer: Self-pay | Admitting: Obstetrics & Gynecology

## 2018-04-18 NOTE — Anesthesia Postprocedure Evaluation (Signed)
Anesthesia Post Note  Patient: Casey Marquez  Procedure(s) Performed: HYSTERECTOMY TOTAL LAPAROSCOPIC (N/A ) LAPAROSCOPIC BILATERAL SALPINGO OOPHORECTOMY (Bilateral )  Patient location during evaluation: PACU Anesthesia Type: General Level of consciousness: awake and alert Pain management: pain level controlled Vital Signs Assessment: post-procedure vital signs reviewed and stable Respiratory status: spontaneous breathing, nonlabored ventilation, respiratory function stable and patient connected to nasal cannula oxygen Cardiovascular status: blood pressure returned to baseline and stable Postop Assessment: no apparent nausea or vomiting Anesthetic complications: no     Last Vitals:  Vitals:   04/17/18 1545 04/17/18 1648  BP: (!) 143/75 127/80  Pulse: 68 66  Resp: 14 16  Temp: (!) 36.1 C 37.2 C  SpO2: 95% 94%    Last Pain:  Vitals:   04/18/18 0809  TempSrc:   PainSc: 8                  Precious Haws Piscitello

## 2018-04-19 LAB — SURGICAL PATHOLOGY

## 2018-06-16 ENCOUNTER — Telehealth: Payer: Self-pay | Admitting: Internal Medicine

## 2018-06-16 NOTE — Telephone Encounter (Signed)
Patient called wanting to be seen today for shortness of breath, clammy,light headedness, and dizzy. I recommend for her to be seen in urgent care of emergency room.

## 2018-06-16 NOTE — Telephone Encounter (Signed)
I just told her to be seen in urgent care or emergency room, she said ok and  Suggest that I put her on mondays schedule to be seen with dr berglund.

## 2018-06-16 NOTE — Telephone Encounter (Signed)
Advised 

## 2018-06-16 NOTE — Telephone Encounter (Signed)
Is the patient going to go to ER or do I need to contact her? Thank you.

## 2018-06-19 ENCOUNTER — Encounter: Payer: Self-pay | Admitting: Internal Medicine

## 2018-06-19 ENCOUNTER — Ambulatory Visit (INDEPENDENT_AMBULATORY_CARE_PROVIDER_SITE_OTHER): Payer: 59 | Admitting: Internal Medicine

## 2018-06-19 VITALS — BP 112/76 | HR 81 | Temp 99.1°F | Resp 16 | Ht 67.0 in | Wt 198.0 lb

## 2018-06-19 DIAGNOSIS — J209 Acute bronchitis, unspecified: Secondary | ICD-10-CM

## 2018-06-19 MED ORDER — LEVALBUTEROL TARTRATE 45 MCG/ACT IN AERO: 2.0000 | INHALATION_SPRAY | Freq: Four times a day (QID) | RESPIRATORY_TRACT | 12 refills | Status: DC | PRN

## 2018-06-19 MED ORDER — AZITHROMYCIN 250 MG PO TABS: ORAL_TABLET | ORAL | 0 refills | Status: AC

## 2018-06-19 NOTE — Progress Notes (Signed)
Date:  06/19/2018   Name:  Casey Marquez   DOB:  1960/12/28   MRN:  [id number]   Chief Complaint: Dizziness; Shortness of Breath; Wheezing; Fever; and Cough  Shortness of Breath  This is a new problem. The current episode started 1 to 4 weeks ago. The problem has been gradually worsening. Associated symptoms include a fever and wheezing. Pertinent negatives include no chest pain, ear pain, leg swelling, neck pain, PND, sore throat, sputum production, syncope or vomiting. The symptoms are aggravated by any activity and lying flat. She has tried beta agonist inhalers for the symptoms. The treatment provided moderate relief. Her past medical history is significant for allergies and asthma.    Review of Systems  Constitutional: Positive for fatigue and fever. Negative for chills and diaphoresis.  HENT: Positive for postnasal drip. Negative for congestion, ear pain, sore throat, tinnitus and trouble swallowing.   Respiratory: Positive for cough, shortness of breath and wheezing. Negative for sputum production and chest tightness.   Cardiovascular: Negative for chest pain, palpitations, leg swelling, syncope and PND.  Gastrointestinal: Negative for vomiting.  Musculoskeletal: Negative for neck pain.  Allergic/Immunologic: Positive for environmental allergies.  Neurological: Positive for light-headedness. Negative for dizziness, tremors, syncope and weakness.  Hematological: Negative for adenopathy.    Patient Active Problem List   Diagnosis Date Noted  . Gastroesophageal reflux disease 06/28/2017  . Environmental and seasonal allergies 01/11/2015  . Depression, major, single episode, in partial remission ([place]) 01/11/2015  . Hot flash, menopausal 01/11/2015  . Nerve pain 01/11/2015    Allergies  Allergen Reactions  . Other     ALL OPIODS MAKE PT NAUSEATED  . Sertraline Nausea Only  . Tegretol  [Carbamazepine]     confusion  . Tape Rash    Past Surgical History:  Procedure  Laterality Date  . EXCISIONAL HEMORRHOIDECTOMY  2002  . fibroidectomy  1990  . GANGLION CYST EXCISION  2007  . INGUINAL HERNIA REPAIR Left 2015  . LAPAROSCOPIC BILATERAL SALPINGO OOPHERECTOMY Bilateral 04/17/2018   Procedure: LAPAROSCOPIC BILATERAL SALPINGO OOPHORECTOMY;  Surgeon: Ward, Honor Loh, MD;  Location: [facility] ORS;  Service: Gynecology;  Laterality: Bilateral;  . LAPAROSCOPIC HYSTERECTOMY N/A 04/17/2018   Procedure: HYSTERECTOMY TOTAL LAPAROSCOPIC;  Surgeon: Ward, Honor Loh, MD;  Location: [facility] ORS;  Service: Gynecology;  Laterality: N/A;  . TENDON REPAIR Right     Social History   Tobacco Use  . Smoking status: Never Smoker  . Smokeless tobacco: Never Used  Substance Use Topics  . Alcohol use: Yes    Alcohol/week: 0.0 standard drinks    Comment: EVERY OTHER DAY  . Drug use: No     Medication list has been reviewed and updated.  Current Meds  Medication Sig  . albuterol (PROVENTIL HFA;VENTOLIN HFA) 108 (90 Base) MCG/ACT inhaler 1-2 puffs every 4-6 hours prn wheezing or cough  . Amino Acids (TRIAMINO) TABS Take 2 tablets by mouth every evening.  . cetirizine (ZYRTEC) 10 MG tablet Take 10 mg by mouth daily as needed for allergies.  . Cholecalciferol (VITAMIN D3) 10000 units capsule Take 10,000 Units by mouth every evening.  Marland Kitchen Dextrose-Fructose-Sod Citrate (NAUZENE) [phone] MG CHEW Chew 1-2 tablets by mouth daily as needed (nausea).  Marland Kitchen FLUoxetine (PROZAC) 10 MG tablet TAKE 1 TABLET(10 MG) BY MOUTH DAILY (Patient taking differently: Take 10 mg by mouth every evening. )  . fluticasone (FLONASE) 50 MCG/ACT nasal spray Place 2 sprays into both nostrils daily as needed for allergies.   Marland Kitchen  ibuprofen (ADVIL,MOTRIN) 800 MG tablet Take 1 tablet (800 mg total) by mouth every 6 (six) hours.  Marland Kitchen ketotifen (ZADITOR) 0.025 % ophthalmic solution Place 1 drop into both eyes daily as needed (allergies).  . LORazepam (ATIVAN) 0.5 MG tablet Take 0.5 tablets (0.25 mg total) by mouth daily as  needed for anxiety.  . Magnesium 400 MG TABS Take 400 mg by mouth every evening.  . Melatonin 5 MG TABS Take 5 mg by mouth at bedtime.  . Multiple Vitamins-Minerals (AIRBORNE) CHEW Chew 1 tablet by mouth daily as needed (immune support).  . OIL OF OREGANO PO Take 1 capsule by mouth daily as needed (immune support).  Marland Kitchen omeprazole (PRILOSEC) 20 MG capsule Take 1 capsule (20 mg total) by mouth daily. (Patient taking differently: Take 20 mg by mouth every evening. )  . OVER THE COUNTER MEDICATION Take 1 mL by mouth daily as needed (mood / memory). Deprenyl otc supplement  . tiZANidine (ZANAFLEX) 4 MG tablet Take 1 tablet (4 mg total) by mouth 3 (three) times daily. (Patient taking differently: Take 2-4 mg by mouth daily as needed for muscle spasms. )  . [DISCONTINUED] guaiFENesin (MUCINEX) 600 MG 12 hr tablet Take 600 mg by mouth daily as needed for cough.    PHQ 2/9 Scores 01/31/2018 08/26/2017 05/13/2017  PHQ - 2 Score 0 4 2  PHQ- 9 Score 2 - 5  Exception Documentation - Patient refusal Other- indicate reason in comment box  Not completed - - stir crazy but nothing out of the ordinary - per patient    Physical Exam  Constitutional: She is oriented to person, place, and time. She appears well-developed. No distress.  HENT:  Head: Normocephalic and atraumatic.  Neck: Normal range of motion. Neck supple.  Cardiovascular: Normal rate and regular rhythm.  Pulmonary/Chest: Effort normal and breath sounds normal. No respiratory distress. She has no decreased breath sounds. She has no wheezes.  Musculoskeletal: Normal range of motion.  Neurological: She is alert and oriented to person, place, and time.  Skin: Skin is warm and dry. No rash noted.  Psychiatric: She has a normal mood and affect. Her behavior is normal. Thought content normal.  Nursing note and vitals reviewed.   BP 112/76   Pulse 81   Temp 99.1 F (37.3 C) (Oral)   Resp 16   Ht 5\' 7"  (1.702 m)   Wt 198 lb (89.8 kg)   LMP  04/10/2008   SpO2 96%   BMI 31.01 kg/m   Assessment and Plan: 1. Bronchitis with bronchospasm Continue fluids, with activity as tolerated Continue flonase and/or claritin prn Change albuterol to xopenex due to tachycardia - azithromycin (ZITHROMAX Z-PAK) 250 MG tablet; UAD  Dispense: 6 each; Refill: 0 - levalbuterol (XOPENEX HFA) 45 MCG/ACT inhaler; Inhale 2 puffs into the lungs every 6 (six) hours as needed for wheezing.  Dispense: 1 Inhaler; Refill: 12   Partially dictated using Editor, commissioning. Any errors are unintentional.  Halina Maidens, MD [facility]  06/19/2018

## 2018-06-20 ENCOUNTER — Telehealth: Payer: Self-pay

## 2018-06-20 NOTE — Telephone Encounter (Signed)
Finished Prior Authorization on patient medication for:  levalbuterol (XOPENEX HFA) 45 MCG/ACT inhaler  Sent to patient's plan through [email or web address]. Awaiting faxed outcome to our office within 72 hours.

## 2018-06-26 NOTE — Telephone Encounter (Signed)
Received fax back from [employer] stating:  This medication or product is on your plan's list of covered drugs. PA is not required at this time. IF pharmacy has any questions regarding this, Please have them call [facility] help desk at [phone].  Reference #- [id number]

## 2018-08-23 IMAGING — CR DG CHEST 2V
2 series · 2 of 2 positions shown · non-contrast
Comparison: 06/28/2017.

CLINICAL DATA: Preop chest x-ray. History of wheezing and
dysrhythmia.

EXAM:
CHEST - 2 VIEW

[chest pa]
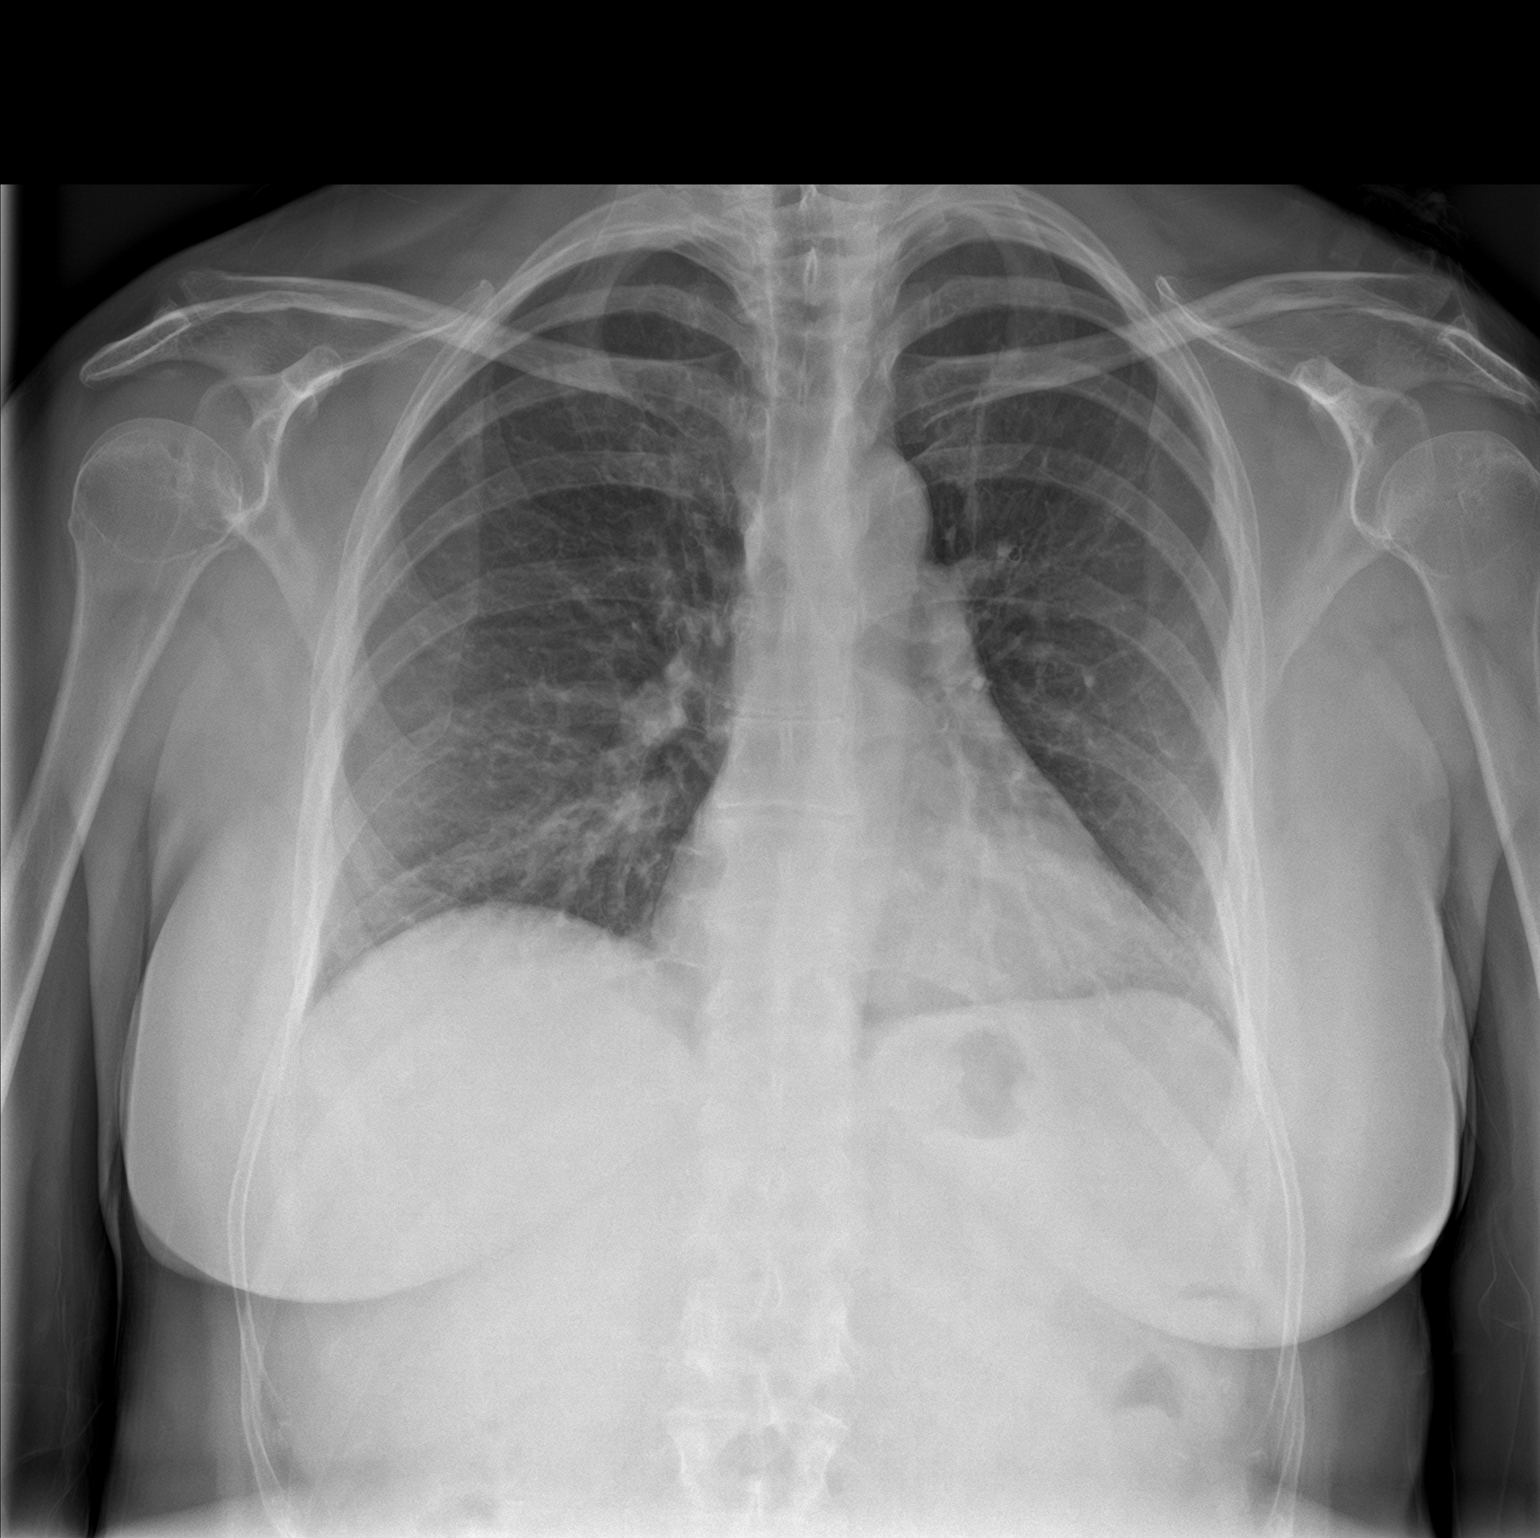

[chest lat]
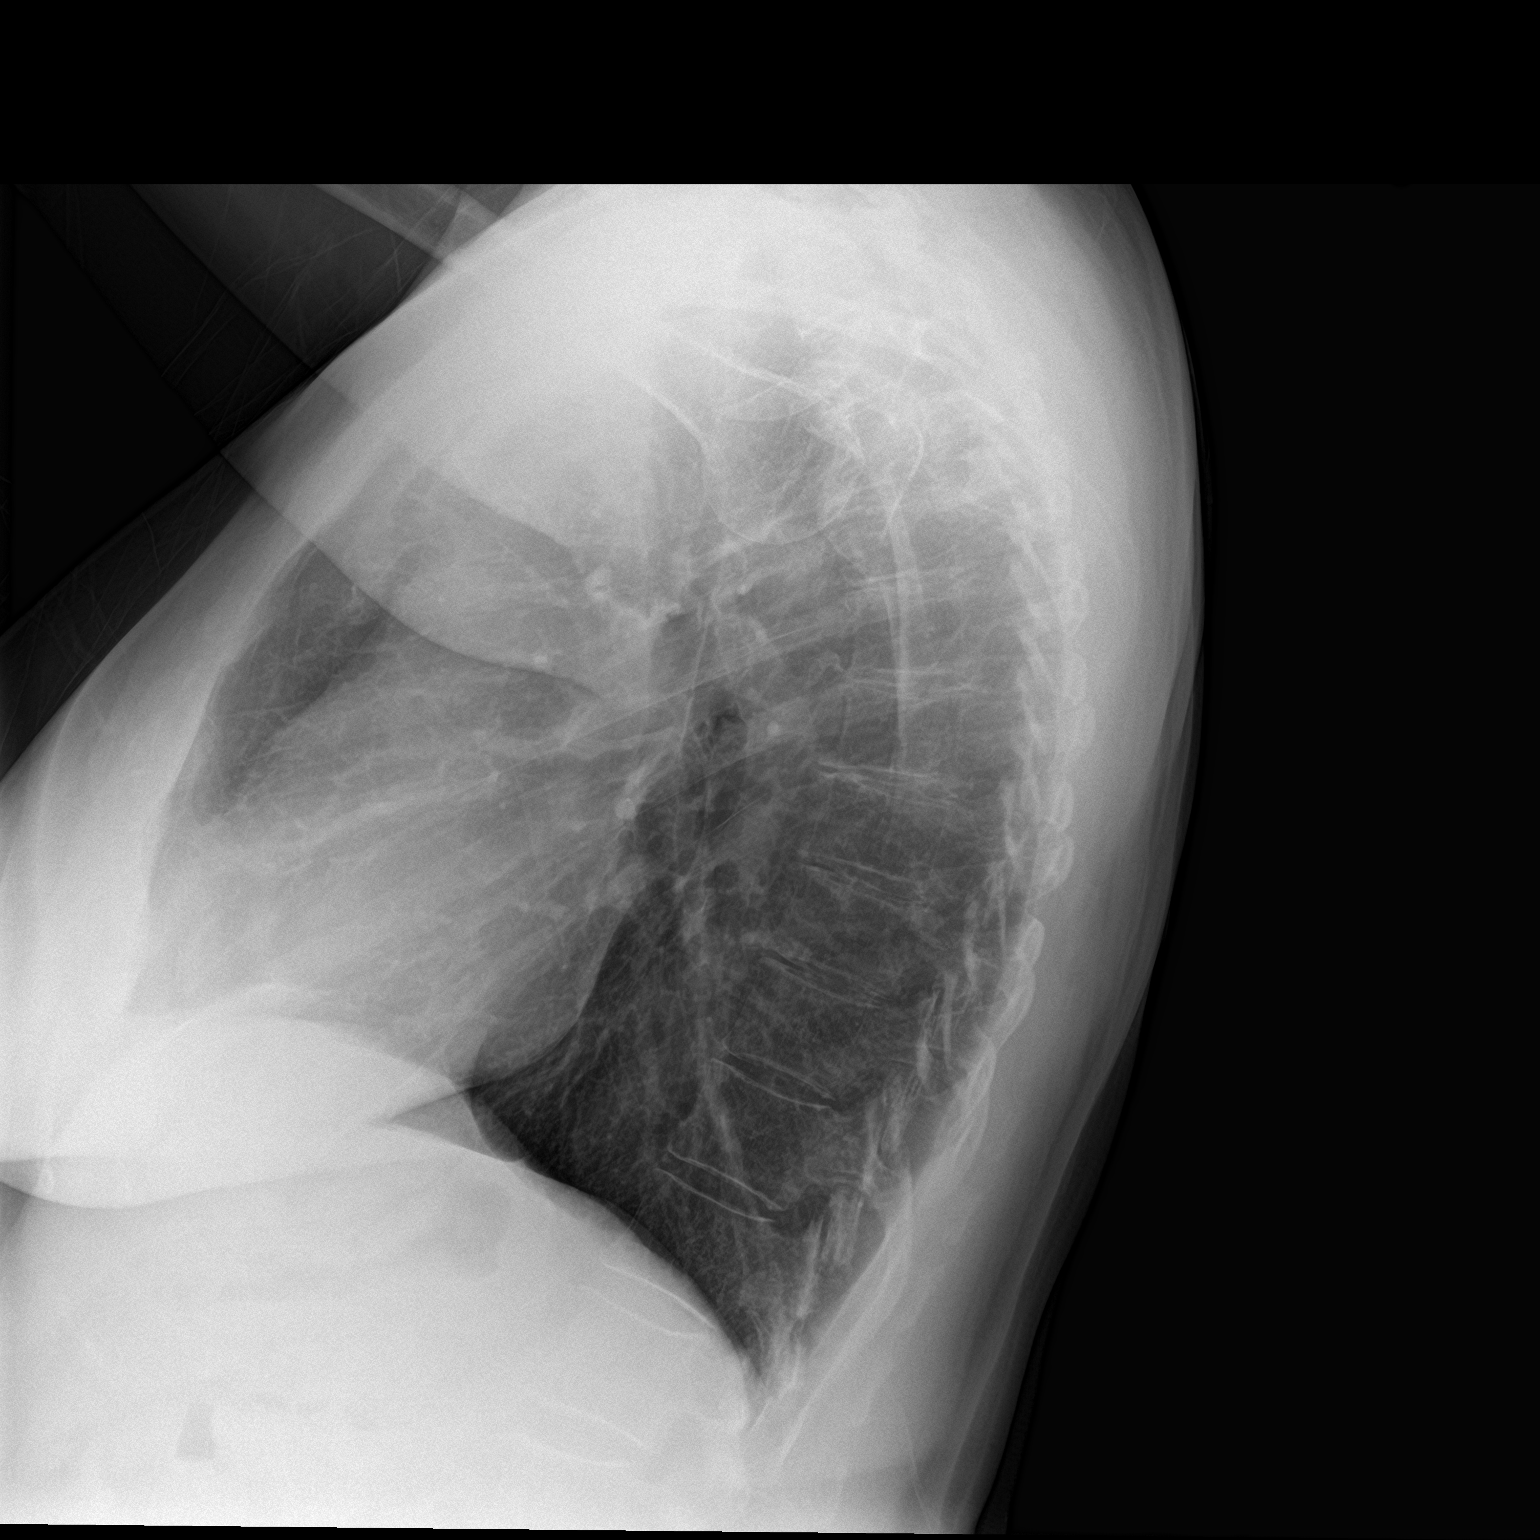

[2 of 2 positions shown; findings below may reference images not displayed]

FINDINGS: Mediastinum and hilar structures normal. Heart size normal. Low lung
volumes with mild bibasilar atelectasis. No pleural effusion or
pneumothorax. No acute bony abnormality.
IMPRESSION: No acute cardiopulmonary disease.

## 2018-09-20 ENCOUNTER — Encounter: Payer: Self-pay | Admitting: Internal Medicine

## 2018-09-20 ENCOUNTER — Ambulatory Visit (INDEPENDENT_AMBULATORY_CARE_PROVIDER_SITE_OTHER): Payer: Managed Care, Other (non HMO) | Admitting: Internal Medicine

## 2018-09-20 ENCOUNTER — Other Ambulatory Visit: Payer: Self-pay | Admitting: Internal Medicine

## 2018-09-20 ENCOUNTER — Other Ambulatory Visit: Payer: Self-pay

## 2018-09-20 VITALS — BP 124/80 | HR 76 | Temp 98.5°F | Ht 67.0 in | Wt 208.0 lb

## 2018-09-20 DIAGNOSIS — J40 Bronchitis, not specified as acute or chronic: Secondary | ICD-10-CM | POA: Diagnosis not present

## 2018-09-20 DIAGNOSIS — R6889 Other general symptoms and signs: Secondary | ICD-10-CM

## 2018-09-20 DIAGNOSIS — K219 Gastro-esophageal reflux disease without esophagitis: Secondary | ICD-10-CM | POA: Diagnosis not present

## 2018-09-20 LAB — POCT INFLUENZA A/B
INFLUENZA A, POC: NEGATIVE
Influenza B, POC: NEGATIVE

## 2018-09-20 MED ORDER — GUAIFENESIN-CODEINE 100-10 MG/5ML PO SYRP: 5.0000 mL | ORAL_SOLUTION | Freq: Three times a day (TID) | ORAL | 0 refills | Status: DC | PRN

## 2018-09-20 MED ORDER — BENZONATATE 100 MG PO CAPS: 100.0000 mg | ORAL_CAPSULE | Freq: Three times a day (TID) | ORAL | 0 refills | Status: DC

## 2018-09-20 MED ORDER — AZITHROMYCIN 250 MG PO TABS: ORAL_TABLET | ORAL | 0 refills | Status: AC

## 2018-09-20 MED ORDER — DICYCLOMINE HCL 20 MG PO TABS: 20.0000 mg | ORAL_TABLET | Freq: Three times a day (TID) | ORAL | 0 refills | Status: DC

## 2018-09-20 NOTE — Progress Notes (Signed)
Date:  09/20/2018   Name:  Casey Marquez   DOB:  Jan 01, 1961   MRN:  [id number]   Chief Complaint: Cough (x 5 days. Green/ yellow. Body aching. Headache. Fever first day. )  Cough  This is a new problem. The current episode started in the past 7 days. The problem has been unchanged. The problem occurs every few minutes. The cough is productive of sputum. Associated symptoms include headaches, nasal congestion, postnasal drip and a sore throat. Pertinent negatives include no chest pain, chills, ear pain, fever, shortness of breath or wheezing. She has tried a beta-agonist inhaler and OTC cough suppressant for the symptoms. The treatment provided mild relief.    Review of Systems  Constitutional: Positive for fatigue. Negative for chills and fever.  HENT: Positive for postnasal drip, sinus pressure and sore throat. Negative for ear pain.   Respiratory: Positive for cough. Negative for chest tightness, shortness of breath and wheezing.   Cardiovascular: Negative for chest pain, palpitations and leg swelling.  Gastrointestinal: Negative for constipation, diarrhea and nausea.  Neurological: Positive for headaches. Negative for dizziness and light-headedness.  Psychiatric/Behavioral: Negative for sleep disturbance.    Patient Active Problem List   Diagnosis Date Noted  . Gastroesophageal reflux disease 06/28/2017  . Environmental and seasonal allergies 01/11/2015  . Depression, major, single episode, in partial remission ([place]) 01/11/2015  . Hot flash, menopausal 01/11/2015  . Nerve pain 01/11/2015    Allergies  Allergen Reactions  . Other     ALL OPIODS MAKE PT NAUSEATED  . Sertraline Nausea Only  . Tegretol  [Carbamazepine]     confusion  . Tape Rash    Past Surgical History:  Procedure Laterality Date  . EXCISIONAL HEMORRHOIDECTOMY  2002  . fibroidectomy  1990  . GANGLION CYST EXCISION  2007  . INGUINAL HERNIA REPAIR Left 2015  . LAPAROSCOPIC BILATERAL SALPINGO  OOPHERECTOMY Bilateral 04/17/2018   Procedure: LAPAROSCOPIC BILATERAL SALPINGO OOPHORECTOMY;  Surgeon: Ward, Honor Loh, MD;  Location: [facility] ORS;  Service: Gynecology;  Laterality: Bilateral;  . LAPAROSCOPIC HYSTERECTOMY N/A 04/17/2018   Procedure: HYSTERECTOMY TOTAL LAPAROSCOPIC;  Surgeon: Ward, Honor Loh, MD;  Location: [facility] ORS;  Service: Gynecology;  Laterality: N/A;  . TENDON REPAIR Right     Social History   Tobacco Use  . Smoking status: Never Smoker  . Smokeless tobacco: Never Used  Substance Use Topics  . Alcohol use: Yes    Alcohol/week: 0.0 standard drinks    Comment: EVERY OTHER DAY  . Drug use: No     Medication list has been reviewed and updated.  Current Meds  Medication Sig  . Amino Acids (TRIAMINO) TABS Take 2 tablets by mouth every evening.  . cetirizine (ZYRTEC) 10 MG tablet Take 10 mg by mouth daily as needed for allergies.  . Cholecalciferol (VITAMIN D3) 10000 units capsule Take 10,000 Units by mouth every evening.  Marland Kitchen Dextrose-Fructose-Sod Citrate (NAUZENE) [phone] MG CHEW Chew 1-2 tablets by mouth daily as needed (nausea).  Marland Kitchen FLUoxetine (PROZAC) 10 MG tablet TAKE 1 TABLET(10 MG) BY MOUTH DAILY (Patient taking differently: Take 10 mg by mouth every evening. )  . fluticasone (FLONASE) 50 MCG/ACT nasal spray Place 2 sprays into both nostrils daily as needed for allergies.   Marland Kitchen ibuprofen (ADVIL,MOTRIN) 800 MG tablet Take 1 tablet (800 mg total) by mouth every 6 (six) hours.  Marland Kitchen ketotifen (ZADITOR) 0.025 % ophthalmic solution Place 1 drop into both eyes daily as needed (allergies).  Marland Kitchen levalbuterol (XOPENEX HFA) 45  MCG/ACT inhaler Inhale 2 puffs into the lungs every 6 (six) hours as needed for wheezing.  Marland Kitchen LORazepam (ATIVAN) 0.5 MG tablet Take 0.5 tablets (0.25 mg total) by mouth daily as needed for anxiety.  . Magnesium 400 MG TABS Take 400 mg by mouth every evening.  . Melatonin 5 MG TABS Take 5 mg by mouth at bedtime.  . Multiple Vitamins-Minerals (AIRBORNE) CHEW  Chew 1 tablet by mouth daily as needed (immune support).  . OIL OF OREGANO PO Take 1 capsule by mouth daily as needed (immune support).  Marland Kitchen omeprazole (PRILOSEC) 20 MG capsule Take 1 capsule (20 mg total) by mouth daily. (Patient taking differently: Take 20 mg by mouth every evening. )  . OVER THE COUNTER MEDICATION Take 1 mL by mouth daily as needed (mood / memory). Deprenyl otc supplement  . tiZANidine (ZANAFLEX) 4 MG tablet Take 1 tablet (4 mg total) by mouth 3 (three) times daily. (Patient taking differently: Take 2-4 mg by mouth daily as needed for muscle spasms. )    PHQ 2/9 Scores 09/20/2018 01/31/2018 08/26/2017 05/13/2017  PHQ - 2 Score 0 0 4 2  PHQ- 9 Score - 2 - 5  Exception Documentation - - Patient refusal Other- indicate reason in comment box  Not completed - - - stir crazy but nothing out of the ordinary - per patient    Physical Exam Constitutional:      Appearance: She is well-developed. She is ill-appearing.  HENT:     Right Ear: Ear canal and external ear normal. Tympanic membrane is not erythematous or retracted.     Left Ear: Ear canal and external ear normal. Tympanic membrane is not erythematous or retracted.     Nose: Mucosal edema present.     Right Sinus: Maxillary sinus tenderness and frontal sinus tenderness present.     Left Sinus: Maxillary sinus tenderness and frontal sinus tenderness present.     Mouth/Throat:     Mouth: Mucous membranes are moist. No oral lesions.     Pharynx: Uvula midline. No oropharyngeal exudate or posterior oropharyngeal erythema.  Neck:     Musculoskeletal: Normal range of motion and neck supple.  Cardiovascular:     Rate and Rhythm: Normal rate and regular rhythm.     Pulses: Normal pulses.     Heart sounds: Normal heart sounds.  Pulmonary:     Effort: Pulmonary effort is normal.     Breath sounds: Examination of the right-upper field reveals wheezing. Examination of the left-upper field reveals wheezing. Wheezing present. No  rhonchi or rales.  Lymphadenopathy:     Cervical: No cervical adenopathy.  Neurological:     Mental Status: She is alert and oriented to person, place, and time.  Psychiatric:        Attention and Perception: Attention and perception normal.        Speech: Speech normal.     BP 124/80   Pulse 76   Temp 98.5 F (36.9 C) (Oral)   Ht 5\' 7"  (1.702 m)   Wt 208 lb (94.3 kg)   LMP 04/10/2008   SpO2 96%   BMI 32.58 kg/m   Assessment and Plan: 1. Flu-like symptoms Testing negative - POCT Influenza A/B  2. Bronchitis Continue mucinex, rest and fluids - guaiFENesin-codeine (ROBITUSSIN AC) 100-10 MG/5ML syrup; Take 5 mLs by mouth 3 (three) times daily as needed for cough.  Dispense: 118 mL; Refill: 0 - azithromycin (ZITHROMAX Z-PAK) 250 MG tablet; UAD  Dispense: 6 each; Refill:  0 - benzonatate (TESSALON) 100 MG capsule; Take 1 capsule (100 mg total) by mouth 3 (three) times daily.  Dispense: 20 capsule; Refill: 0  3. Gastroesophageal reflux disease, esophagitis presence not specified - dicyclomine (BENTYL) 20 MG tablet; Take 1 tablet (20 mg total) by mouth 3 (three) times daily before meals.  Dispense: 30 tablet; Refill: 0   Partially dictated using Editor, commissioning. Any errors are unintentional.  Halina Maidens, MD [facility]  09/20/2018

## 2018-09-26 ENCOUNTER — Telehealth: Payer: Self-pay

## 2018-09-26 ENCOUNTER — Encounter: Payer: Self-pay | Admitting: Internal Medicine

## 2018-09-26 ENCOUNTER — Other Ambulatory Visit: Payer: Self-pay | Admitting: Internal Medicine

## 2018-09-26 DIAGNOSIS — J019 Acute sinusitis, unspecified: Secondary | ICD-10-CM

## 2018-09-26 MED ORDER — AMOXICILLIN 875 MG PO TABS: 875.0000 mg | ORAL_TABLET | Freq: Two times a day (BID) | ORAL | 0 refills | Status: DC

## 2018-09-26 NOTE — Telephone Encounter (Signed)
Patient called stating she has finished all of her abx from last visit. She is still complaining of having bloody/green mucous. Wants to know if she can try diff abx?  Please Advise

## 2018-09-26 NOTE — Telephone Encounter (Signed)
Patient sent my chart message about this and responded there.

## 2018-09-26 NOTE — Telephone Encounter (Signed)
Sent to [employer].  However, the Zpak is still working for 4 more days so she should consider giving it a bit more time.

## 2018-12-07 ENCOUNTER — Encounter: Payer: Self-pay | Admitting: Internal Medicine

## 2019-01-25 ENCOUNTER — Encounter: Payer: Self-pay | Admitting: Internal Medicine

## 2019-01-26 ENCOUNTER — Other Ambulatory Visit: Payer: Self-pay | Admitting: Internal Medicine

## 2019-01-26 MED ORDER — FLUOXETINE HCL 10 MG PO TABS: ORAL_TABLET | ORAL | 3 refills | Status: DC

## 2019-03-20 ENCOUNTER — Encounter: Payer: Self-pay | Admitting: Internal Medicine

## 2019-03-21 ENCOUNTER — Other Ambulatory Visit: Payer: Self-pay | Admitting: Internal Medicine

## 2019-03-21 DIAGNOSIS — K649 Unspecified hemorrhoids: Secondary | ICD-10-CM

## 2019-03-21 MED ORDER — HYDROCORTISONE ACETATE 1 % EX OINT: 1.0000 "application " | TOPICAL_OINTMENT | Freq: Four times a day (QID) | CUTANEOUS | 2 refills | Status: DC | PRN

## 2019-04-22 ENCOUNTER — Encounter: Payer: Self-pay | Admitting: Internal Medicine

## 2019-04-23 ENCOUNTER — Other Ambulatory Visit: Payer: Self-pay

## 2019-04-23 ENCOUNTER — Telehealth: Payer: Self-pay | Admitting: Internal Medicine

## 2019-04-23 ENCOUNTER — Ambulatory Visit (INDEPENDENT_AMBULATORY_CARE_PROVIDER_SITE_OTHER): Payer: Managed Care, Other (non HMO) | Admitting: Internal Medicine

## 2019-04-23 ENCOUNTER — Encounter: Payer: Self-pay | Admitting: Internal Medicine

## 2019-04-23 VITALS — BP 122/78 | HR 80 | Temp 98.7°F | Ht 67.0 in | Wt 200.2 lb

## 2019-04-23 DIAGNOSIS — F324 Major depressive disorder, single episode, in partial remission: Secondary | ICD-10-CM

## 2019-04-23 DIAGNOSIS — J0100 Acute maxillary sinusitis, unspecified: Secondary | ICD-10-CM

## 2019-04-23 MED ORDER — AMOXICILLIN 875 MG PO TABS: 875.0000 mg | ORAL_TABLET | Freq: Two times a day (BID) | ORAL | 0 refills | Status: DC

## 2019-04-23 MED ORDER — LORAZEPAM 0.5 MG PO TABS: 0.2500 mg | ORAL_TABLET | Freq: Every day | ORAL | 0 refills | Status: DC | PRN

## 2019-04-23 NOTE — Progress Notes (Signed)
Date:  04/23/2019   Name:  Casey Marquez   DOB:  Dec 21, 1960   MRN:  [id number]   Chief Complaint: Sinusitis (Sore throat- drainage., mostly left ear pain, dizziness, and diarrhea- 2 days ago- 3 diarrhea movements today.. Started 2 weeks. Having green discharge from left side of face and metallic taste in mouth. )  Sinusitis This is a recurrent problem. The current episode started in the past 7 days. The problem has been gradually worsening since onset. There has been no fever. The pain is moderate. Associated symptoms include congestion, ear pain, headaches, sinus pressure and a sore throat. Pertinent negatives include no chills, coughing or shortness of breath. Treatments tried: flonase and zyrtec.    Review of Systems  Constitutional: Negative for chills, fatigue, fever and unexpected weight change.  HENT: Positive for congestion, ear pain, postnasal drip, sinus pressure and sore throat.   Eyes: Negative for visual disturbance.  Respiratory: Negative for cough, chest tightness, shortness of breath and wheezing.   Cardiovascular: Negative for chest pain, palpitations and leg swelling.  Gastrointestinal: Positive for diarrhea. Negative for anal bleeding and constipation.  Musculoskeletal: Negative for arthralgias.  Skin: Negative for color change and rash.  Neurological: Positive for headaches. Negative for dizziness, weakness and numbness.  Psychiatric/Behavioral: Negative for sleep disturbance. The patient is nervous/anxious.     Patient Active Problem List   Diagnosis Date Noted  . Gastroesophageal reflux disease 06/28/2017  . Environmental and seasonal allergies 01/11/2015  . Depression, major, single episode, in partial remission ([place]) 01/11/2015  . Hot flash, menopausal 01/11/2015  . Nerve pain 01/11/2015    Allergies  Allergen Reactions  . Other     ALL OPIODS MAKE PT NAUSEATED  . Sertraline Nausea Only  . Tegretol  [Carbamazepine]     confusion  . Tape Rash     Past Surgical History:  Procedure Laterality Date  . EXCISIONAL HEMORRHOIDECTOMY  2002  . fibroidectomy  1990  . GANGLION CYST EXCISION  2007  . INGUINAL HERNIA REPAIR Left 2015  . LAPAROSCOPIC BILATERAL SALPINGO OOPHERECTOMY Bilateral 04/17/2018   Procedure: LAPAROSCOPIC BILATERAL SALPINGO OOPHORECTOMY;  Surgeon: Ward, Honor Loh, MD;  Location: [facility] ORS;  Service: Gynecology;  Laterality: Bilateral;  . LAPAROSCOPIC HYSTERECTOMY N/A 04/17/2018   Procedure: HYSTERECTOMY TOTAL LAPAROSCOPIC;  Surgeon: Ward, Honor Loh, MD;  Location: [facility] ORS;  Service: Gynecology;  Laterality: N/A;  . TENDON REPAIR Right     Social History   Tobacco Use  . Smoking status: Never Smoker  . Smokeless tobacco: Never Used  Substance Use Topics  . Alcohol use: Yes    Alcohol/week: 0.0 standard drinks    Comment: EVERY OTHER DAY  . Drug use: No     Medication list has been reviewed and updated.  No outpatient medications have been marked as taking for the 04/23/19 encounter (Office Visit) with Glean Hess, MD.    [facility] 2/9 Scores 04/23/2019 09/20/2018 01/31/2018 08/26/2017  PHQ - 2 Score 6 0 0 4  PHQ- 9 Score 20 - 2 -  Exception Documentation - - - Patient refusal  Not completed - - - -    BP Readings from Last 3 Encounters:  04/23/19 122/78  09/20/18 124/80  06/19/18 112/76    Physical Exam Constitutional:      Appearance: She is well-developed.  HENT:     Right Ear: Ear canal and external ear normal. Tympanic membrane is retracted. Tympanic membrane is not erythematous.     Left Ear: Ear canal  and external ear normal. Tympanic membrane is erythematous and retracted.     Nose:     Right Sinus: Maxillary sinus tenderness and frontal sinus tenderness present.     Left Sinus: Maxillary sinus tenderness and frontal sinus tenderness present.     Mouth/Throat:     Mouth: No oral lesions.     Pharynx: Uvula midline. Posterior oropharyngeal erythema present. No oropharyngeal exudate.   Cardiovascular:     Rate and Rhythm: Normal rate and regular rhythm.     Heart sounds: Normal heart sounds.  Pulmonary:     Breath sounds: Normal breath sounds. No wheezing or rales.  Lymphadenopathy:     Cervical: No cervical adenopathy.  Neurological:     Mental Status: She is alert and oriented to person, place, and time.  Psychiatric:        Attention and Perception: Attention normal.        Mood and Affect: Mood normal.     Wt Readings from Last 3 Encounters:  04/23/19 200 lb 3.2 oz (90.8 kg)  09/20/18 208 lb (94.3 kg)  06/19/18 198 lb (89.8 kg)    BP 122/78   Pulse 80   Temp 98.7 F (37.1 C) (Oral)   Ht 5\' 7"  (1.702 m)   Wt 200 lb 3.2 oz (90.8 kg)   LMP 04/10/2008   SpO2 97%   BMI 31.36 kg/m   Assessment and Plan: 1. Acute non-recurrent maxillary sinusitis Continue flonase, neti-pot rinses, zyrtec - amoxicillin (AMOXIL) 875 MG tablet; Take 1 tablet (875 mg total) by mouth 2 (two) times daily for 10 days.  Dispense: 20 tablet; Refill: 0  2. Depression, major, single episode, in partial remission (HCC) - LORazepam (ATIVAN) 0.5 MG tablet; Take 0.5 tablets (0.25 mg total) by mouth daily as needed for anxiety.  Dispense: 30 tablet; Refill: 0   Partially dictated using Editor, commissioning. Any errors are unintentional.  Halina Maidens, MD [facility]  04/23/2019

## 2019-04-23 NOTE — Telephone Encounter (Signed)
I told her in a my chart message, if she does not have a fever we need to see her. We can see her today at 3:40PM in person as long as she has not had a fever of 101 or more. Otherwise, she will need to be VV at 3:40PM.  Thank you.

## 2019-04-23 NOTE — Telephone Encounter (Signed)
Pt wants appt for possible sinus infection? Pt had a low grade fever last night no fever today, also complaining of sore throat, ear pain, dizzy and diarrhea. Please advise if I need to set her up?

## 2019-05-04 ENCOUNTER — Other Ambulatory Visit: Payer: Self-pay | Admitting: Internal Medicine

## 2019-05-04 ENCOUNTER — Encounter: Payer: Self-pay | Admitting: Internal Medicine

## 2019-05-04 DIAGNOSIS — J0100 Acute maxillary sinusitis, unspecified: Secondary | ICD-10-CM

## 2019-05-04 MED ORDER — AMOXICILLIN 875 MG PO TABS: 875.0000 mg | ORAL_TABLET | Freq: Two times a day (BID) | ORAL | 0 refills | Status: AC

## 2019-05-13 ENCOUNTER — Encounter: Payer: Self-pay | Admitting: Internal Medicine

## 2019-05-14 NOTE — Telephone Encounter (Signed)
Please advise 

## 2019-07-19 ENCOUNTER — Encounter: Payer: Self-pay | Admitting: Internal Medicine

## 2020-01-06 ENCOUNTER — Other Ambulatory Visit: Payer: Self-pay | Admitting: Internal Medicine

## 2020-01-06 NOTE — Telephone Encounter (Signed)
Called pt and LM with spouse to ask pt to call back this week to make appt. Pt was not at home. 30 day courtesy refill Requested Prescriptions  Pending Prescriptions Disp Refills  . FLUoxetine (PROZAC) 10 MG tablet [Pharmacy Med Name: Fluoxetine Hydrochloride 10mg  Tablet] 30 tablet 0    Sig: Take 1 tablet by mouth daily.     Psychiatry:  Antidepressants - SSRI Failed - 01/06/2020 12:16 PM      Failed - Valid encounter within last 6 months    Recent Outpatient Visits          8 months ago Acute non-recurrent maxillary sinusitis   [facility] Glean Hess, MD   1 year ago Flu-like symptoms   [facility] Glean Hess, MD   1 year ago Bronchitis with bronchospasm   [facility] Glean Hess, MD   1 year ago Environmental and seasonal allergies   [facility] Glean Hess, MD   1 year ago Pelvic pain in female   [facility] Glean Hess, MD             Passed - Completed PHQ-2 or PHQ-9 in the last 360 days.

## 2020-01-14 ENCOUNTER — Other Ambulatory Visit: Payer: Self-pay | Admitting: Internal Medicine

## 2020-01-14 ENCOUNTER — Encounter: Payer: Self-pay | Admitting: Internal Medicine

## 2020-03-20 ENCOUNTER — Encounter: Payer: Self-pay | Admitting: Internal Medicine

## 2020-03-21 ENCOUNTER — Encounter: Payer: Self-pay | Admitting: Internal Medicine

## 2020-03-21 ENCOUNTER — Other Ambulatory Visit: Payer: Self-pay

## 2020-03-21 ENCOUNTER — Ambulatory Visit (INDEPENDENT_AMBULATORY_CARE_PROVIDER_SITE_OTHER): Payer: 59 | Admitting: Internal Medicine

## 2020-03-21 VITALS — BP 120/76 | HR 96 | Ht 67.0 in | Wt 203.0 lb

## 2020-03-21 DIAGNOSIS — F064 Anxiety disorder due to known physiological condition: Secondary | ICD-10-CM | POA: Diagnosis not present

## 2020-03-21 DIAGNOSIS — J4520 Mild intermittent asthma, uncomplicated: Secondary | ICD-10-CM | POA: Insufficient documentation

## 2020-03-21 DIAGNOSIS — J3089 Other allergic rhinitis: Secondary | ICD-10-CM

## 2020-03-21 DIAGNOSIS — K219 Gastro-esophageal reflux disease without esophagitis: Secondary | ICD-10-CM

## 2020-03-21 DIAGNOSIS — M62838 Other muscle spasm: Secondary | ICD-10-CM

## 2020-03-21 DIAGNOSIS — F324 Major depressive disorder, single episode, in partial remission: Secondary | ICD-10-CM

## 2020-03-21 MED ORDER — BUDESONIDE 90 MCG/ACT IN AEPB: 1.0000 | INHALATION_SPRAY | Freq: Two times a day (BID) | RESPIRATORY_TRACT | 1 refills | Status: DC

## 2020-03-21 MED ORDER — FLUOXETINE HCL 10 MG PO TABS: ORAL_TABLET | ORAL | 1 refills | Status: DC

## 2020-03-21 MED ORDER — LORAZEPAM 0.5 MG PO TABS: 0.2500 mg | ORAL_TABLET | Freq: Every day | ORAL | 0 refills | Status: DC | PRN

## 2020-03-21 MED ORDER — LEVALBUTEROL TARTRATE 45 MCG/ACT IN AERO: 2.0000 | INHALATION_SPRAY | Freq: Four times a day (QID) | RESPIRATORY_TRACT | 0 refills | Status: DC | PRN

## 2020-03-21 MED ORDER — TIZANIDINE HCL 4 MG PO TABS: 2.0000 mg | ORAL_TABLET | Freq: Every day | ORAL | 0 refills | Status: DC | PRN

## 2020-03-21 MED ORDER — DICYCLOMINE HCL 20 MG PO TABS: 20.0000 mg | ORAL_TABLET | Freq: Three times a day (TID) | ORAL | 0 refills | Status: DC

## 2020-03-21 NOTE — Progress Notes (Signed)
Date:  03/21/2020   Name:  Casey Marquez   DOB:  [birth year]   MRN:  [id number]   Chief Complaint: Anxiety (Follow up and med refills.) and Depression  Depression        This is a chronic problem.The problem is unchanged.  Associated symptoms include no fatigue and no headaches.  Past treatments include SSRIs - Selective serotonin reuptake inhibitors.  Compliance with treatment is good.  Previous treatment provided significant relief.  Past medical history includes anxiety.   Asthma She complains of chest tightness, shortness of breath and wheezing. This is a recurrent problem. The current episode started more than 1 year ago. The problem occurs every several days. The problem has been unchanged. Associated symptoms include postnasal drip. Pertinent negatives include no chest pain, dyspnea on exertion, fever, headaches, orthopnea or trouble swallowing. Her symptoms are aggravated by exercise and strenuous activity. Her symptoms are alleviated by beta-agonist. She reports significant improvement on treatment. Her past medical history is significant for asthma.  Anxiety Presents for follow-up visit. Symptoms include excessive worry, nervous/anxious behavior, palpitations and shortness of breath. Patient reports no chest pain or dizziness. Symptoms occur occasionally. The quality of sleep is good.   Her past medical history is significant for asthma. Compliance with medications: uses lorazepam only as needed.    Lab Results  Component Value Date   CREATININE 0.78 04/11/2018   BUN 28 (H) 04/11/2018   NA 142 04/11/2018   K 3.7 04/11/2018   CL 105 04/11/2018   CO2 29 04/11/2018   Lab Results  Component Value Date   CHOL 234 (H) 11/11/2016   HDL 55 11/11/2016   LDLCALC 156 (H) 11/11/2016   TRIG 115 11/11/2016   CHOLHDL 4.3 11/11/2016   Lab Results  Component Value Date   TSH 4.220 11/11/2016   No results found for: HGBA1C Lab Results  Component Value Date   WBC 8.4 04/11/2018    HGB 13.6 04/11/2018   HCT 41.0 04/11/2018   MCV 87.6 04/11/2018   PLT 298 04/11/2018   Lab Results  Component Value Date   ALT 8 11/11/2016   AST 20 11/11/2016   ALKPHOS 76 11/11/2016   BILITOT 0.4 11/11/2016     Review of Systems  Constitutional: Negative for chills, fatigue, fever and unexpected weight change.  HENT: Positive for postnasal drip. Negative for trouble swallowing.   Respiratory: Positive for chest tightness, shortness of breath and wheezing. Negative for choking.   Cardiovascular: Positive for palpitations. Negative for chest pain and dyspnea on exertion.  Gastrointestinal: Negative for abdominal pain, constipation and diarrhea.       Uses bentyl PRN for reflux  Allergic/Immunologic: Positive for environmental allergies.  Neurological: Negative for dizziness and headaches.  Psychiatric/Behavioral: Positive for depression. Negative for dysphoric mood and sleep disturbance. The patient is nervous/anxious.     Patient Active Problem List   Diagnosis Date Noted  . 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 Gastroesophageal reflux disease 06/28/2017  . Environmental and seasonal allergies 01/11/2015  . Depression, major, single episode, in partial remission ([place]) 01/11/2015  . Hot flash, menopausal 01/11/2015  . Nerve pain 01/11/2015    Allergies  Allergen Reactions  . Other     ALL OPIODS MAKE PT NAUSEATED  . Sertraline Nausea Only  . Tegretol  [Carbamazepine]     confusion  . Tape Rash    Past Surgical History:  Procedure Laterality Date  . EXCISIONAL HEMORRHOIDECTOMY  2002  .  fibroidectomy  1990  . GANGLION CYST EXCISION  2007  . INGUINAL HERNIA REPAIR Left 2015  . LAPAROSCOPIC BILATERAL SALPINGO OOPHERECTOMY Bilateral 04/17/2018   Procedure: LAPAROSCOPIC BILATERAL SALPINGO OOPHORECTOMY;  Surgeon: Ward, Honor Loh, MD;  Location: [facility] ORS;  Service: Gynecology;  Laterality: Bilateral;  . LAPAROSCOPIC HYSTERECTOMY N/A 04/17/2018    Procedure: HYSTERECTOMY TOTAL LAPAROSCOPIC;  Surgeon: Ward, Honor Loh, MD;  Location: [facility] ORS;  Service: Gynecology;  Laterality: N/A;  . TENDON REPAIR Right     Social History   Tobacco Use  . Smoking status: Never Smoker  . Smokeless tobacco: Never Used  Vaping Use  . Vaping Use: Never used  Substance Use Topics  . Alcohol use: Yes    Alcohol/week: 0.0 standard drinks    Comment: EVERY OTHER DAY  . Drug use: No     Medication list has been reviewed and updated.  Current Meds  Medication Sig  . Amino Acids (TRIAMINO) TABS Take 2 tablets by mouth every evening.  . cetirizine (ZYRTEC) 10 MG tablet Take 10 mg by mouth daily as needed for allergies.  . Cholecalciferol (VITAMIN D3) 10000 units capsule Take 10,000 Units by mouth every evening.  Marland Kitchen Dextrose-Fructose-Sod Citrate (NAUZENE) [phone] MG CHEW Chew 1-2 tablets by mouth daily as needed (nausea).  . dicyclomine (BENTYL) 20 MG tablet Take 20 mg by mouth every 6 (six) hours. PRN  . FLUoxetine (PROZAC) 10 MG tablet Take 1 tablet by mouth daily.  . fluticasone (FLONASE) 50 MCG/ACT nasal spray Place 2 sprays into both nostrils daily as needed for allergies.   . Hydrocortisone Acetate 1 % OINT Apply 1 application topically 4 (four) times daily as needed.  Marland Kitchen ibuprofen (ADVIL,MOTRIN) 800 MG tablet Take 1 tablet (800 mg total) by mouth every 6 (six) hours.  Marland Kitchen levalbuterol (XOPENEX HFA) 45 MCG/ACT inhaler Inhale 2 puffs into the lungs every 6 (six) hours as needed for wheezing.  Marland Kitchen LORazepam (ATIVAN) 0.5 MG tablet Take 0.5 tablets (0.25 mg total) by mouth daily as needed for anxiety.  . Magnesium 400 MG TABS Take 400 mg by mouth every evening.  . Melatonin 5 MG TABS Take 5 mg by mouth at bedtime.  . Multiple Vitamins-Minerals (AIRBORNE) CHEW Chew 1 tablet by mouth daily as needed (immune support).  . OIL OF OREGANO PO Take 1 capsule by mouth daily as needed (immune support).  Marland Kitchen omeprazole (PRILOSEC) 20 MG capsule Take 1 capsule  (20 mg total) by mouth daily. (Patient taking differently: Take 20 mg by mouth every evening. )  . OVER THE COUNTER MEDICATION Take 1 mL by mouth daily as needed (mood / memory). Deprenyl otc supplement  . tiZANidine (ZANAFLEX) 4 MG tablet Take 1 tablet (4 mg total) by mouth 3 (three) times daily. (Patient taking differently: Take 2-4 mg by mouth daily as needed for muscle spasms. )    PHQ 2/9 Scores 03/21/2020 04/23/2019 09/20/2018 01/31/2018  PHQ - 2 Score 4 6 0 0  PHQ- 9 Score 8 20 - 2  Exception Documentation - - - -  Not completed - - - -    GAD 7 : Generalized Anxiety Score 03/21/2020  Nervous, Anxious, on Edge 3  Control/stop worrying 3  Worry too much - different things 3  Trouble relaxing 1  Restless 1  Easily annoyed or irritable 1  Afraid - awful might happen 0  Total GAD 7 Score 12  Anxiety Difficulty Somewhat difficult    BP Readings from Last 3 Encounters:  03/21/20 120/76  04/23/19 122/78  09/20/18 124/80    Physical Exam Vitals and nursing note reviewed.  Constitutional:      General: She is not in acute distress.    Appearance: She is well-developed.  HENT:     Head: Normocephalic and atraumatic.  Cardiovascular:     Rate and Rhythm: Normal rate and regular rhythm.     Pulses: Normal pulses.     Heart sounds: No murmur heard.   Pulmonary:     Effort: Pulmonary effort is normal. No respiratory distress.     Breath sounds: Normal breath sounds. No rhonchi.  Musculoskeletal:     Cervical back: Normal range of motion.     Right lower leg: No edema.     Left lower leg: No edema.  Lymphadenopathy:     Cervical: No cervical adenopathy.  Skin:    General: Skin is warm and dry.     Findings: No rash.  Neurological:     General: No focal deficit present.     Mental Status: She is alert and oriented to person, place, and time.  Psychiatric:        Attention and Perception: Attention normal.        Mood and Affect: Mood is anxious.        Speech: Speech  normal.     Wt Readings from Last 3 Encounters:  03/21/20 (!) 203 lb (92.1 kg)  04/23/19 200 lb 3.2 oz (90.8 kg)  09/20/18 208 lb (94.3 kg)    BP 120/76   Pulse 96   Ht 5\' 7"  (1.702 m)   Wt (!) 203 lb (92.1 kg)   LMP 04/10/2008   SpO2 96%   BMI 31.79 kg/m   Assessment and Plan: 1. Depression, major, single episode, in partial remission (HCC) Clinically stable on current regimen with good control of symptoms, No SI or HI. Will continue current therapy. - FLUoxetine (PROZAC) 10 MG tablet; Take 1 tablet by mouth daily.  Dispense: 90 tablet; Refill: 1  2. Environmental and seasonal allergies Continue Flonase daily  3. Anxiety disorder due to known physiological condition Uses lorazepam PRN - last refill 6 months ago - LORazepam (ATIVAN) 0.5 MG tablet; Take 0.5 tablets (0.25 mg total) by mouth daily as needed for anxiety.  Dispense: 30 tablet; Refill: 0  4. Mild intermittent asthma without complication Begin controlled medication daily - Pulmicort 1-2 times per day - levalbuterol (XOPENEX HFA) 45 MCG/ACT inhaler; Inhale 2 puffs into the lungs every 6 (six) hours as needed for wheezing.  Dispense: 3 Inhaler; Refill: 0 - Budesonide 90 MCG/ACT inhaler; Inhale 1 puff into the lungs 2 (two) times daily.  Dispense: 3 each; Refill: 1  5. Muscle spasm - tiZANidine (ZANAFLEX) 4 MG tablet; Take 0.5-1 tablets (2-4 mg total) by mouth daily as needed for muscle spasms.  Dispense: 90 tablet; Refill: 0  6. Gastroesophageal reflux disease without esophagitis - dicyclomine (BENTYL) 20 MG tablet; Take 1 tablet (20 mg total) by mouth 3 (three) times daily before meals. PRN  Dispense: 90 tablet; Refill: 0   Partially dictated using Editor, commissioning. Any errors are unintentional.  Halina Maidens, MD [facility]  03/21/2020

## 2020-03-21 NOTE — Patient Instructions (Signed)
Use Pulmicort inhaler - one puff twice a day.  Rinse mouth after use.  Continue to use the Xopenex as needed.

## 2020-03-31 ENCOUNTER — Telehealth: Payer: Self-pay | Admitting: Internal Medicine

## 2020-03-31 DIAGNOSIS — K219 Gastro-esophageal reflux disease without esophagitis: Secondary | ICD-10-CM

## 2020-04-04 ENCOUNTER — Other Ambulatory Visit: Payer: Self-pay

## 2020-04-04 DIAGNOSIS — K219 Gastro-esophageal reflux disease without esophagitis: Secondary | ICD-10-CM

## 2020-04-04 MED ORDER — DICYCLOMINE HCL 20 MG PO TABS: 20.0000 mg | ORAL_TABLET | Freq: Three times a day (TID) | ORAL | 0 refills | Status: DC

## 2020-04-04 NOTE — Telephone Encounter (Signed)
Called pt left VM to call back. Wanted to inform pt that her medication was sent to [facility].  Will route result note to [facility] Nurse Triage for follow up when patient returns call to clinic.    KP

## 2020-04-04 NOTE — Telephone Encounter (Signed)
[place]. They state they did not receive this RX. Verified pharmacy and the original [place] this medication was sent to  was incorrect. Updated in system. Please resent to correct pharmacy.    Pharmacy:  [place], [place] Phone:  [phone]  Fax:  [phone]

## 2020-04-04 NOTE — Telephone Encounter (Signed)
Patient is not seen a [employer].

## 2020-04-07 NOTE — Telephone Encounter (Signed)
Call to patient- left message-calling to verify patient got medication requested- left message to call office to verify medication received per request

## 2020-04-20 ENCOUNTER — Other Ambulatory Visit: Payer: Self-pay | Admitting: Internal Medicine

## 2020-04-20 DIAGNOSIS — J4520 Mild intermittent asthma, uncomplicated: Secondary | ICD-10-CM

## 2020-04-30 ENCOUNTER — Other Ambulatory Visit: Payer: Self-pay | Admitting: Internal Medicine

## 2020-04-30 DIAGNOSIS — M62838 Other muscle spasm: Secondary | ICD-10-CM

## 2020-04-30 NOTE — Telephone Encounter (Signed)
Requested medication (s) are due for refill today - too soon  Requested medication (s) are on the active medication list -yes  Future visit scheduled -no  Last refill: 03/21/20  Notes to clinic: Request for RF non delegated Rx  Requested Prescriptions  Pending Prescriptions Disp Refills   tiZANidine (ZANAFLEX) 4 MG tablet [Pharmacy Med Name: Tizanidine Hydrochloride 4mg  Tablet] 90 tablet 0    Sig: Take 1/2 to 1 tablet (2 to 4 mg total) by mouth daily as needed for muscle spasms.      Not Delegated - Cardiovascular:  Alpha-2 Agonists - tizanidine Failed - 04/30/2020 12:17 PM      Failed - This refill cannot be delegated      Passed - Valid encounter within last 6 months    Recent Outpatient Visits           1 month ago Environmental and seasonal allergies   [facility] Glean Hess, [facility]   1 year ago Acute non-recurrent maxillary sinusitis   [facility] Glean Hess, MD   1 year ago Flu-like symptoms   [facility] Glean Hess, MD   1 year ago Bronchitis with bronchospasm   [facility] Glean Hess, MD   2 years ago Environmental and seasonal allergies   [facility] Glean Hess, MD                  Requested Prescriptions  Pending Prescriptions Disp Refills   tiZANidine (ZANAFLEX) 4 MG tablet [Pharmacy Med Name: Tizanidine Hydrochloride 4mg  Tablet] 90 tablet 0    Sig: Take 1/2 to 1 tablet (2 to 4 mg total) by mouth daily as needed for muscle spasms.      Not Delegated - Cardiovascular:  Alpha-2 Agonists - tizanidine Failed - 04/30/2020 12:17 PM      Failed - This refill cannot be delegated      Passed - Valid encounter within last 6 months    Recent Outpatient Visits           1 month ago Environmental and seasonal allergies   [place], Laura H, MD   1 year ago Acute non-recurrent maxillary sinusitis   [facility] Glean Hess, MD   1 year ago Flu-like  symptoms   [facility] Glean Hess, MD   1 year ago Bronchitis with bronchospasm   [facility] Glean Hess, MD   2 years ago Environmental and seasonal allergies   [facility] Glean Hess, MD

## 2020-06-24 ENCOUNTER — Other Ambulatory Visit: Payer: Self-pay | Admitting: Internal Medicine

## 2020-06-24 DIAGNOSIS — J4520 Mild intermittent asthma, uncomplicated: Secondary | ICD-10-CM

## 2020-06-24 NOTE — Telephone Encounter (Signed)
Called pharmacy and per [facility] pt was sent 1 inhaler 06/02/20 and is out of refills. Refilled for 3 months. Routing to office to review.

## 2020-09-02 ENCOUNTER — Other Ambulatory Visit: Payer: Self-pay | Admitting: Internal Medicine

## 2020-09-02 DIAGNOSIS — F324 Major depressive disorder, single episode, in partial remission: Secondary | ICD-10-CM

## 2020-10-14 ENCOUNTER — Encounter: Payer: Self-pay | Admitting: Internal Medicine

## 2020-10-15 ENCOUNTER — Ambulatory Visit: Payer: 59 | Admitting: Internal Medicine

## 2020-10-16 ENCOUNTER — Ambulatory Visit: Payer: Self-pay | Admitting: Internal Medicine

## 2020-10-23 ENCOUNTER — Ambulatory Visit: Payer: Self-pay | Admitting: Internal Medicine

## 2020-11-03 ENCOUNTER — Ambulatory Visit: Payer: 59 | Admitting: Internal Medicine

## 2020-12-07 ENCOUNTER — Other Ambulatory Visit: Payer: Self-pay | Admitting: Internal Medicine

## 2020-12-07 DIAGNOSIS — F324 Major depressive disorder, single episode, in partial remission: Secondary | ICD-10-CM

## 2020-12-07 NOTE — Telephone Encounter (Signed)
Last OV addressing issue: 03/21/20  Last RF: 09/02/20 #90 RF due, no future visit schedule.  MyChart message sent to pt to call and schedule appt.

## 2020-12-08 NOTE — Telephone Encounter (Signed)
Patient will call back to set up appointment.  

## 2020-12-08 NOTE — Telephone Encounter (Signed)
Please call pt to schedule a depression follow up.  KP

## 2020-12-31 ENCOUNTER — Other Ambulatory Visit: Payer: Self-pay

## 2020-12-31 ENCOUNTER — Encounter: Payer: Self-pay | Admitting: Internal Medicine

## 2020-12-31 ENCOUNTER — Ambulatory Visit (INDEPENDENT_AMBULATORY_CARE_PROVIDER_SITE_OTHER): Payer: 59 | Admitting: Internal Medicine

## 2020-12-31 VITALS — BP 104/60 | HR 84 | Ht 67.0 in | Wt 206.0 lb

## 2020-12-31 DIAGNOSIS — M25522 Pain in left elbow: Secondary | ICD-10-CM | POA: Diagnosis not present

## 2020-12-31 DIAGNOSIS — F324 Major depressive disorder, single episode, in partial remission: Secondary | ICD-10-CM

## 2020-12-31 DIAGNOSIS — K219 Gastro-esophageal reflux disease without esophagitis: Secondary | ICD-10-CM

## 2020-12-31 DIAGNOSIS — G8929 Other chronic pain: Secondary | ICD-10-CM

## 2020-12-31 DIAGNOSIS — F064 Anxiety disorder due to known physiological condition: Secondary | ICD-10-CM

## 2020-12-31 DIAGNOSIS — M792 Neuralgia and neuritis, unspecified: Secondary | ICD-10-CM

## 2020-12-31 DIAGNOSIS — J4520 Mild intermittent asthma, uncomplicated: Secondary | ICD-10-CM | POA: Diagnosis not present

## 2020-12-31 DIAGNOSIS — M62838 Other muscle spasm: Secondary | ICD-10-CM

## 2020-12-31 MED ORDER — FLUOXETINE HCL 20 MG PO TABS: 20.0000 mg | ORAL_TABLET | Freq: Every day | ORAL | 1 refills | Status: DC

## 2020-12-31 MED ORDER — LEVALBUTEROL TARTRATE 45 MCG/ACT IN AERO: 2.0000 | INHALATION_SPRAY | Freq: Four times a day (QID) | RESPIRATORY_TRACT | 1 refills | Status: DC | PRN

## 2020-12-31 MED ORDER — PULMICORT FLEXHALER 90 MCG/ACT IN AEPB: INHALATION_SPRAY | RESPIRATORY_TRACT | 0 refills | Status: DC

## 2020-12-31 MED ORDER — TIZANIDINE HCL 4 MG PO TABS: 4.0000 mg | ORAL_TABLET | Freq: Three times a day (TID) | ORAL | 0 refills | Status: DC | PRN

## 2020-12-31 MED ORDER — DICYCLOMINE HCL 20 MG PO TABS: 20.0000 mg | ORAL_TABLET | Freq: Three times a day (TID) | ORAL | 0 refills | Status: DC

## 2020-12-31 MED ORDER — PROGESTERONE 200 MG PO CAPS: 200.0000 mg | ORAL_CAPSULE | Freq: Every day | ORAL | 1 refills | Status: DC

## 2020-12-31 NOTE — Progress Notes (Signed)
Date:  12/31/2020   Name:  Casey Marquez   DOB:  September 25, 1960   MRN:  [id number]   Chief Complaint: Depression (Refill and follow up.), Asthma, and Gastroesophageal Reflux  Depression        This is a chronic problem.The problem is unchanged.  Associated symptoms include decreased concentration, fatigue and decreased interest.  Associated symptoms include no headaches and no suicidal ideas.     The symptoms are aggravated by nothing.  Past treatments include SSRIs - Selective serotonin reuptake inhibitors.  Compliance with treatment is good. Asthma She complains of chest tightness and cough. There is no shortness of breath or wheezing. This is a recurrent problem. Pertinent negatives include no chest pain, headaches or trouble swallowing. Her symptoms are alleviated by beta-agonist and steroid inhaler. Her past medical history is significant for asthma.  Gastroesophageal Reflux She complains of coughing. She reports no abdominal pain, no chest pain or no wheezing. Associated symptoms include fatigue. She has tried a PPI for the symptoms. The treatment provided significant relief.   Immunization History  Administered Date(s) Administered  . Influenza,inj,Quad PF,6+ Mos 08/26/2017  . PFIZER(Purple Top)SARS-COV-2 Vaccination 11/13/2019, 12/04/2019  . Tdap 06/23/2010    Lab Results  Component Value Date   CREATININE 0.78 04/11/2018   BUN 28 (H) 04/11/2018   NA 142 04/11/2018   K 3.7 04/11/2018   CL 105 04/11/2018   CO2 29 04/11/2018   Lab Results  Component Value Date   CHOL 234 (H) 11/11/2016   HDL 55 11/11/2016   LDLCALC 156 (H) 11/11/2016   TRIG 115 11/11/2016   CHOLHDL 4.3 11/11/2016   Lab Results  Component Value Date   TSH 4.220 11/11/2016   No results found for: HGBA1C Lab Results  Component Value Date   WBC 8.4 04/11/2018   HGB 13.6 04/11/2018   HCT 41.0 04/11/2018   MCV 87.6 04/11/2018   PLT 298 04/11/2018   Lab Results  Component Value Date   ALT 8  11/11/2016   AST 20 11/11/2016   ALKPHOS 76 11/11/2016   BILITOT 0.4 11/11/2016     Review of Systems  Constitutional: Positive for fatigue.  HENT: Positive for sinus pressure. Negative for trouble swallowing.   Respiratory: Positive for cough. Negative for chest tightness, shortness of breath and wheezing.   Cardiovascular: Negative for chest pain, palpitations and leg swelling.  Gastrointestinal: Positive for abdominal distention. Negative for abdominal pain and constipation.  Musculoskeletal: Positive for arthralgias (left elbow).  Allergic/Immunologic: Positive for environmental allergies.  Neurological: Negative for dizziness, light-headedness and headaches.  Psychiatric/Behavioral: Positive for decreased concentration and depression. Negative for suicidal ideas.    Patient Active Problem List   Diagnosis Date Noted  . 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 Mild intermittent asthma without complication 40/98/1191  . Gastroesophageal reflux disease 06/28/2017  . Environmental and seasonal allergies 01/11/2015  . Depression, major, single episode, in partial remission ([place]) 01/11/2015  . Hot flash, menopausal 01/11/2015  . Nerve pain 01/11/2015    Allergies  Allergen Reactions  . Tegretol  [Carbamazepine]     confusion  . Other Nausea Only    ALL OPIODS MAKE PT NAUSEATED  . Sertraline Nausea Only  . Tape Rash    Past Surgical History:  Procedure Laterality Date  . EXCISIONAL HEMORRHOIDECTOMY  2002  . fibroidectomy  1990  . GANGLION CYST EXCISION  2007  . INGUINAL HERNIA REPAIR Left 2015  . LAPAROSCOPIC BILATERAL SALPINGO OOPHERECTOMY Bilateral 04/17/2018  Procedure: LAPAROSCOPIC BILATERAL SALPINGO OOPHORECTOMY;  Surgeon: Ward, Honor Loh, MD;  Location: [facility] ORS;  Service: Gynecology;  Laterality: Bilateral;  . LAPAROSCOPIC HYSTERECTOMY N/A 04/17/2018   Procedure: HYSTERECTOMY TOTAL LAPAROSCOPIC;  Surgeon: Ward, Honor Loh, MD;  Location:  [facility] ORS;  Service: Gynecology;  Laterality: N/A;  . TENDON REPAIR Right     Social History   Tobacco Use  . Smoking status: Never Smoker  . Smokeless tobacco: Never Used  Vaping Use  . Vaping Use: Never used  Substance Use Topics  . Alcohol use: Yes    Alcohol/week: 0.0 standard drinks    Comment: EVERY OTHER DAY  . Drug use: No     Medication list has been reviewed and updated.  Current Meds  Medication Sig  . Amino Acids (TRIAMINO) TABS Take 2 tablets by mouth every evening.  . cetirizine (ZYRTEC) 10 MG tablet Take 10 mg by mouth daily as needed for allergies.  . Cholecalciferol (VITAMIN D3) 10000 units capsule Take 10,000 Units by mouth every evening.  Marland Kitchen Dextrose-Fructose-Sod Citrate [id number] MG CHEW Chew 1-2 tablets by mouth daily as needed (nausea).  . dicyclomine (BENTYL) 20 MG tablet Take 1 tablet (20 mg total) by mouth 3 (three) times daily before meals. PRN  . FLUoxetine (PROZAC) 10 MG tablet Take 1 tablet by mouth daily.  . fluticasone (FLONASE) 50 MCG/ACT nasal spray Place 2 sprays into both nostrils daily as needed for allergies.   . Hydrocortisone Acetate 1 % OINT Apply 1 application topically 4 (four) times daily as needed.  Marland Kitchen ibuprofen (ADVIL,MOTRIN) 800 MG tablet Take 1 tablet (800 mg total) by mouth every 6 (six) hours.  Marland Kitchen levalbuterol (XOPENEX HFA) 45 MCG/ACT inhaler Inhale 2 puffs by mouth into the lungs every six hours as needed for wheezing.  Marland Kitchen LORazepam (ATIVAN) 0.5 MG tablet Take 0.5 tablets (0.25 mg total) by mouth daily as needed for anxiety.  . Magnesium 400 MG TABS Take 400 mg by mouth every evening.  . Melatonin 5 MG TABS Take 5 mg by mouth at bedtime.  . Multiple Vitamins-Minerals (AIRBORNE) CHEW Chew 1 tablet by mouth daily as needed (immune support).  . OIL OF OREGANO PO Take 1 capsule by mouth daily as needed (immune support).  Marland Kitchen omeprazole (PRILOSEC) 20 MG capsule Take 1 capsule (20 mg total) by mouth daily. (Patient taking differently: Take  20 mg by mouth every evening.)  . OVER THE COUNTER MEDICATION Take 1 mL by mouth daily as needed (mood / memory). Deprenyl otc supplement  . progesterone (PROMETRIUM) 200 MG capsule Take 1 capsule by mouth at bedtime.  Marland Kitchen PULMICORT FLEXHALER 90 MCG/ACT inhaler Inhale 1 puff into the lungs twice daily.  Marland Kitchen tiZANidine (ZANAFLEX) 4 MG tablet Take 1/2 to 1 tablet (2 to 4 mg total) by mouth daily as needed for muscle spasms.    PHQ 2/9 Scores 12/31/2020 03/21/2020 04/23/2019 09/20/2018  PHQ - 2 Score 6 4 6  0  PHQ- 9 Score 12 8 20  -  Exception Documentation - - - -  Not completed - - - -    GAD 7 : Generalized Anxiety Score 12/31/2020 03/21/2020  Nervous, Anxious, on Edge 2 3  Control/stop worrying 2 3  Worry too much - different things 2 3  Trouble relaxing 2 1  Restless 1 1  Easily annoyed or irritable 1 1  Afraid - awful might happen 1 0  Total GAD 7 Score 11 12  Anxiety Difficulty Somewhat difficult Somewhat difficult    BP  Readings from Last 3 Encounters:  12/31/20 104/60  03/21/20 120/76  04/23/19 122/78    Physical Exam Vitals and nursing note reviewed.  Constitutional:      General: She is not in acute distress.    Appearance: Normal appearance. She is well-developed.  HENT:     Head: Normocephalic and atraumatic.  Neck:     Vascular: No carotid bruit.  Cardiovascular:     Rate and Rhythm: Normal rate and regular rhythm.     Pulses: Normal pulses.     Heart sounds: No murmur heard.   Pulmonary:     Effort: Pulmonary effort is normal. No respiratory distress.     Breath sounds: No wheezing or rhonchi.  Musculoskeletal:     Left elbow: No swelling, deformity or effusion. Normal range of motion. No tenderness.     Cervical back: Normal range of motion.     Right lower leg: No edema.     Left lower leg: No edema.  Lymphadenopathy:     Cervical: No cervical adenopathy.  Skin:    General: Skin is warm and dry.     Capillary Refill: Capillary refill takes less than 2  seconds.     Findings: No rash.  Neurological:     General: No focal deficit present.     Mental Status: She is alert and oriented to person, place, and time.  Psychiatric:        Mood and Affect: Mood normal.        Behavior: Behavior normal.     Wt Readings from Last 3 Encounters:  12/31/20 206 lb (93.4 kg)  03/21/20 (!) 203 lb (92.1 kg)  04/23/19 200 lb 3.2 oz (90.8 kg)    BP 104/60   Pulse 84   Ht 5\' 7"  (1.702 m)   Wt 206 lb (93.4 kg)   LMP 04/10/2008   SpO2 97%   BMI 32.26 kg/m   Assessment and Plan: 1. Depression, major, single episode, in partial remission ([place]) Not well controlled - will increase dose to 20 mg. No SI/HI - FLUoxetine (PROZAC) 20 MG tablet; Take 1 tablet (20 mg total) by mouth daily.  Dispense: 90 tablet; Refill: 1  2. Anxiety disorder due to known physiological condition Continue PRN lorazepam  3. Mild intermittent asthma without complication Asthma is well controlled on steroid inhaler and PRN albuterol - Budesonide (PULMICORT FLEXHALER) 90 MCG/ACT inhaler; Inhale 1 puff into the lungs twice daily.  Dispense: 3 each; Refill: 0 - levalbuterol (XOPENEX HFA) 45 MCG/ACT inhaler; Inhale 2 puffs into the lungs every 6 (six) hours as needed for wheezing.  Dispense: 45 g; Refill: 1  4. Chronic pain of left elbow Recommend SM consult  5. Nerve pain In left lower abdomen - mild intermittent and unchanged   Partially dictated using Editor, commissioning. Any errors are unintentional.  Halina Maidens, MD [facility]  12/31/2020

## 2021-02-18 ENCOUNTER — Encounter: Payer: Self-pay | Admitting: Internal Medicine

## 2021-04-09 ENCOUNTER — Other Ambulatory Visit: Payer: Self-pay | Admitting: Internal Medicine

## 2021-04-09 DIAGNOSIS — M62838 Other muscle spasm: Secondary | ICD-10-CM

## 2021-04-09 NOTE — Telephone Encounter (Signed)
  Notes to clinic:  requested script has expired  Review for continued use and refill   Requested Prescriptions  Pending Prescriptions Disp Refills   tiZANidine (ZANAFLEX) 4 MG tablet [Pharmacy Med Name: Tizanidine '4mg'$  Tablet] 30 tablet     Sig: Take 1/2 to 1 tablet (2 to 4 mg total) by mouth daily as needed for muscle spasms.     Not Delegated - Cardiovascular:  Alpha-2 Agonists - tizanidine Failed - 04/09/2021 12:43 PM      Failed - This refill cannot be delegated      Passed - Valid encounter within last 6 months    Recent Outpatient Visits           3 months ago Depression, major, single episode, in partial remission [facility])   [facility] Glean Hess, [facility]   1 year ago Environmental and seasonal allergies   [facility] Glean Hess, MD   1 year ago Acute non-recurrent maxillary sinusitis   [facility] Glean Hess, MD   2 years ago Flu-like symptoms   [facility] Glean Hess, MD   2 years ago Bronchitis with bronchospasm   [facility] Glean Hess, MD

## 2021-04-10 NOTE — Telephone Encounter (Signed)
Pt needs an appt for a CPE and depression.  KP

## 2021-04-27 ENCOUNTER — Encounter: Payer: Self-pay | Admitting: Internal Medicine

## 2021-04-28 ENCOUNTER — Other Ambulatory Visit: Payer: Self-pay | Admitting: Internal Medicine

## 2021-04-28 DIAGNOSIS — J4520 Mild intermittent asthma, uncomplicated: Secondary | ICD-10-CM

## 2021-04-28 MED ORDER — PSEUDOEPHEDRINE-GUAIFENESIN ER 60-600 MG PO TB12: 1.0000 | ORAL_TABLET | Freq: Two times a day (BID) | ORAL | 0 refills | Status: DC

## 2021-05-09 ENCOUNTER — Other Ambulatory Visit: Payer: Self-pay | Admitting: Internal Medicine

## 2021-05-09 DIAGNOSIS — J4520 Mild intermittent asthma, uncomplicated: Secondary | ICD-10-CM

## 2021-05-10 NOTE — Telephone Encounter (Signed)
Attempted to call pharmacy no one took call after waiting for 4 minutes. Will need to call pharmacy to clarify refill date

## 2021-05-11 NOTE — Telephone Encounter (Signed)
Requested medication (s) are due for refill today:   Provider to review  Requested medication (s) are on the active medication list:   Yes  Future visit scheduled:   Yes   Last ordered: 04/28/2021 #60, 0 refills  Returned because it's a non delegated refill   Requested Prescriptions  Pending Prescriptions Disp Refills   MUCINEX D 60-600 MG 12 hr tablet [Pharmacy Med Name: Mucinex D '600mg'$ -'60mg'$  Extended-Release Tablet] 60 tablet 0    Sig: Take 1 tablet by mouth every 12 hours.     Not Delegated - Ear, Nose, and Throat:  Combination Products with Pseudoephedrine Failed - 05/09/2021  3:46 PM      Failed - This refill cannot be delegated      Passed - Last BP in normal range    BP Readings from Last 1 Encounters:  12/31/20 104/60          Passed - Valid encounter within last 12 months    Recent Outpatient Visits           4 months ago Depression, major, single episode, in partial remission [facility])   [facility] Glean Hess, MD   1 year ago Environmental and seasonal allergies   [facility] Glean Hess, MD   2 years ago Acute non-recurrent maxillary sinusitis   [facility] Glean Hess, MD   2 years ago Flu-like symptoms   [facility] Glean Hess, MD   2 years ago Bronchitis with bronchospasm   [facility] Glean Hess, MD       Future Appointments             In 3 months Army Melia Jesse Sans, MD [facility], [facility]

## 2021-05-22 ENCOUNTER — Other Ambulatory Visit: Payer: Self-pay | Admitting: Internal Medicine

## 2021-05-22 DIAGNOSIS — F324 Major depressive disorder, single episode, in partial remission: Secondary | ICD-10-CM

## 2021-07-07 ENCOUNTER — Encounter: Payer: Self-pay | Admitting: Internal Medicine

## 2021-07-07 ENCOUNTER — Ambulatory Visit (INDEPENDENT_AMBULATORY_CARE_PROVIDER_SITE_OTHER): Payer: 59 | Admitting: Internal Medicine

## 2021-07-07 ENCOUNTER — Other Ambulatory Visit: Payer: Self-pay

## 2021-07-07 VITALS — BP 126/76 | HR 76 | Temp 98.4°F | Ht 67.0 in | Wt 200.0 lb

## 2021-07-07 DIAGNOSIS — J0100 Acute maxillary sinusitis, unspecified: Secondary | ICD-10-CM | POA: Diagnosis not present

## 2021-07-07 DIAGNOSIS — D485 Neoplasm of uncertain behavior of skin: Secondary | ICD-10-CM | POA: Diagnosis not present

## 2021-07-07 DIAGNOSIS — T753XXA Motion sickness, initial encounter: Secondary | ICD-10-CM

## 2021-07-07 DIAGNOSIS — J4520 Mild intermittent asthma, uncomplicated: Secondary | ICD-10-CM

## 2021-07-07 MED ORDER — PSEUDOEPHEDRINE-GUAIFENESIN ER 60-600 MG PO TB12: 1.0000 | ORAL_TABLET | Freq: Two times a day (BID) | ORAL | 2 refills | Status: DC

## 2021-07-07 MED ORDER — SCOPOLAMINE 1 MG/3DAYS TD PT72: 1.0000 | MEDICATED_PATCH | TRANSDERMAL | 0 refills | Status: DC

## 2021-07-07 MED ORDER — AZITHROMYCIN 250 MG PO TABS: ORAL_TABLET | ORAL | 0 refills | Status: AC

## 2021-07-07 NOTE — Progress Notes (Signed)
Date:  07/07/2021   Name:  Casey Marquez   DOB:  [birth year]   MRN:  [id number]   Chief Complaint: Ear Pain  Sinus Problem This is a new problem. The current episode started in the past 7 days. The problem has been gradually worsening since onset. The pain is moderate. Associated symptoms include congestion, coughing, ear pain (Left), headaches, sinus pressure and a sore throat. Pertinent negatives include no chills, hoarse voice or shortness of breath. Treatments tried: mucinex-D and Flonase.  Rash This is a chronic problem. The affected locations include the left lower leg and right lower leg. The rash is characterized by redness. She was exposed to nothing. Associated symptoms include congestion, coughing and a sore throat. Pertinent negatives include no fatigue, fever or shortness of breath.  Motion sickness - planning a trip to [place] to include boat tours etc.  Dramamine makes her very sleepy.  She would like to try trans-derm scop.  Lab Results  Component Value Date   CREATININE 0.78 04/11/2018   BUN 28 (H) 04/11/2018   NA 142 04/11/2018   K 3.7 04/11/2018   CL 105 04/11/2018   CO2 29 04/11/2018   Lab Results  Component Value Date   CHOL 234 (H) 11/11/2016   HDL 55 11/11/2016   LDLCALC 156 (H) 11/11/2016   TRIG 115 11/11/2016   CHOLHDL 4.3 11/11/2016   Lab Results  Component Value Date   TSH 4.220 11/11/2016   No results found for: HGBA1C Lab Results  Component Value Date   WBC 8.4 04/11/2018   HGB 13.6 04/11/2018   HCT 41.0 04/11/2018   MCV 87.6 04/11/2018   PLT 298 04/11/2018   Lab Results  Component Value Date   ALT 8 11/11/2016   AST 20 11/11/2016   ALKPHOS 76 11/11/2016   BILITOT 0.4 11/11/2016     Review of Systems  Constitutional:  Negative for chills, fatigue and fever.  HENT:  Positive for congestion, ear pain (Left), postnasal drip, sinus pressure and sore throat. Negative for hoarse voice.   Respiratory:  Positive for cough. Negative for  shortness of breath and wheezing.   Cardiovascular:  Negative for chest pain and palpitations.  Skin:  Positive for rash.  Neurological:  Positive for headaches. Negative for dizziness.   Patient Active Problem List   Diagnosis Date Noted   Anxiety disorder due to known physiological condition 03/21/2020   Mild intermittent asthma without complication 81/08/7508   Gastroesophageal reflux disease 06/28/2017   Environmental and seasonal allergies 01/11/2015   Depression, major, single episode, in partial remission ([place]) 01/11/2015   Hot flash, menopausal 01/11/2015   Nerve pain 01/11/2015    Allergies  Allergen Reactions   Tegretol  [Carbamazepine]     confusion   Other Nausea Only    ALL OPIODS MAKE PT NAUSEATED   Sertraline Nausea Only   Tape Rash    Past Surgical History:  Procedure Laterality Date   EXCISIONAL HEMORRHOIDECTOMY  2002   fibroidectomy  1990   GANGLION CYST EXCISION  2007   INGUINAL HERNIA REPAIR Left 2015   LAPAROSCOPIC BILATERAL SALPINGO OOPHERECTOMY Bilateral 04/17/2018   Procedure: LAPAROSCOPIC BILATERAL SALPINGO OOPHORECTOMY;  Surgeon: Ward, Honor Loh, MD;  Location: [facility] ORS;  Service: Gynecology;  Laterality: Bilateral;   LAPAROSCOPIC HYSTERECTOMY N/A 04/17/2018   Procedure: HYSTERECTOMY TOTAL LAPAROSCOPIC;  Surgeon: Ward, Honor Loh, MD;  Location: [facility] ORS;  Service: Gynecology;  Laterality: N/A;   TENDON REPAIR Right     Social History  Tobacco Use   Smoking status: Never   Smokeless tobacco: Never  Vaping Use   Vaping Use: Never used  Substance Use Topics   Alcohol use: Yes    Alcohol/week: 0.0 standard drinks    Comment: EVERY OTHER DAY   Drug use: No     Medication list has been reviewed and updated.  Current Meds  Medication Sig   Amino Acids (TRIAMINO) TABS Take 2 tablets by mouth every evening.   Budesonide (PULMICORT FLEXHALER) 90 MCG/ACT inhaler Inhale 1 puff into the lungs twice daily.   cetirizine (ZYRTEC) 10 MG tablet Take  10 mg by mouth daily as needed for allergies.   Cholecalciferol (VITAMIN D3) 10000 units capsule Take 10,000 Units by mouth every evening.   Dextrose-Fructose-Sod Citrate [phone] MG CHEW Chew 1-2 tablets by mouth daily as needed (nausea).   dicyclomine (BENTYL) 20 MG tablet Take 1 tablet (20 mg total) by mouth 3 (three) times daily before meals. PRN   FLUoxetine (PROZAC) 20 MG tablet Take 1 tablet by mouth once daily.   fluticasone (FLONASE) 50 MCG/ACT nasal spray Place 2 sprays into both nostrils daily as needed for allergies.    Hydrocortisone Acetate 1 % OINT Apply 1 application topically 4 (four) times daily as needed.   ibuprofen (ADVIL,MOTRIN) 800 MG tablet Take 1 tablet (800 mg total) by mouth every 6 (six) hours.   levalbuterol (XOPENEX HFA) 45 MCG/ACT inhaler Inhale 2 puffs into the lungs every 6 (six) hours as needed for wheezing.   LORazepam (ATIVAN) 0.5 MG tablet Take 0.5 tablets (0.25 mg total) by mouth daily as needed for anxiety.   Magnesium 400 MG TABS Take 400 mg by mouth every evening.   Melatonin 5 MG TABS Take 5 mg by mouth at bedtime.   Multiple Vitamins-Minerals (AIRBORNE) CHEW Chew 1 tablet by mouth daily as needed (immune support).   OIL OF OREGANO PO Take 1 capsule by mouth daily as needed (immune support).   omeprazole (PRILOSEC) 20 MG capsule Take 1 capsule (20 mg total) by mouth daily. (Patient taking differently: Take 20 mg by mouth every evening.)   OVER THE COUNTER MEDICATION Take 1 mL by mouth daily as needed (mood / memory). Deprenyl otc supplement   progesterone (PROMETRIUM) 200 MG capsule Take 1 capsule by mouth at bedtime.   pseudoephedrine-guaifenesin (MUCINEX D) 60-600 MG 12 hr tablet Take 1 tablet by mouth every 12 (twelve) hours.   tiZANidine (ZANAFLEX) 4 MG tablet Take 1/2 to 1 tablet (2 to 4 mg total) by mouth daily as needed for muscle spasms.    PHQ 2/9 Scores 07/07/2021 12/31/2020 03/21/2020 04/23/2019  PHQ - 2 Score 2 6 4 6   PHQ- 9 Score 4 12 8 20    Exception Documentation - - - -  Not completed - - - -    GAD 7 : Generalized Anxiety Score 07/07/2021 12/31/2020 03/21/2020  Nervous, Anxious, on Edge 1 2 3   Control/stop worrying 0 2 3  Worry too much - different things 0 2 3  Trouble relaxing 1 2 1   Restless 0 1 1  Easily annoyed or irritable 1 1 1   Afraid - awful might happen 0 1 0  Total GAD 7 Score 3 11 12   Anxiety Difficulty Not difficult at all Somewhat difficult Somewhat difficult    BP Readings from Last 3 Encounters:  07/07/21 126/76  12/31/20 104/60  03/21/20 120/76    Physical Exam Vitals and nursing note reviewed.  Constitutional:  General: She is not in acute distress.    Appearance: Normal appearance. She is well-developed.  HENT:     Head: Normocephalic and atraumatic.     Right Ear: Ear canal and external ear normal. Tympanic membrane is not erythematous or retracted.     Left Ear: Ear canal and external ear normal. Tympanic membrane is not erythematous or retracted.     Nose:     Right Sinus: Maxillary sinus tenderness and frontal sinus tenderness present.     Left Sinus: Maxillary sinus tenderness and frontal sinus tenderness present.     Mouth/Throat:     Mouth: No oral lesions.  Cardiovascular:     Rate and Rhythm: Normal rate and regular rhythm.     Pulses: Normal pulses.     Heart sounds: Normal heart sounds.  Pulmonary:     Effort: Pulmonary effort is normal. No respiratory distress.     Breath sounds: Normal breath sounds. No wheezing, rhonchi or rales.  Musculoskeletal:     Cervical back: Normal range of motion and neck supple.  Lymphadenopathy:     Cervical: No cervical adenopathy.  Skin:    General: Skin is warm and dry.     Findings: Lesion (flat subcut hemorrhages 3 mm or less scattered on lower legs) present. No rash.  Neurological:     Mental Status: She is alert and oriented to person, place, and time.  Psychiatric:        Mood and Affect: Mood normal.        Behavior:  Behavior normal.    Wt Readings from Last 3 Encounters:  07/07/21 200 lb (90.7 kg)  12/31/20 206 lb (93.4 kg)  03/21/20 (!) 203 lb (92.1 kg)    BP 126/76   Pulse 76   Temp 98.4 F (36.9 C)   Ht 5\' 7"  (1.702 m)   Wt 200 lb (90.7 kg)   LMP 04/10/2008   SpO2 99%   BMI 31.32 kg/m   Assessment and Plan: 1. Acute non-recurrent maxillary sinusitis Continue Mucinex-D and Flonase Zpak  2. Mild intermittent asthma without complication Continue Pulmicort inhaler daily - pseudoephedrine-guaifenesin (MUCINEX D) 60-600 MG 12 hr tablet; Take 1 tablet by mouth every 12 (twelve) hours.  Dispense: 60 tablet; Refill: 2  3. Motion sickness, initial encounter - scopolamine (TRANSDERM-SCOP, 1.5 MG,) 1 MG/3DAYS; Place 1 patch (1.5 mg total) onto the skin every 3 (three) days.  Dispense: 4 patch; Refill: 0  4. Neoplasm of uncertain behavior of skin - Ambulatory referral to Dermatology   Partially dictated using Dragon software. Any errors are unintentional.  Halina Maidens, MD [facility]  07/07/2021

## 2021-08-12 ENCOUNTER — Other Ambulatory Visit: Payer: Self-pay | Admitting: Internal Medicine

## 2021-08-12 DIAGNOSIS — F324 Major depressive disorder, single episode, in partial remission: Secondary | ICD-10-CM

## 2021-08-12 NOTE — Telephone Encounter (Signed)
Dose was increased to 20mg 

## 2021-08-31 ENCOUNTER — Encounter: Payer: Self-pay | Admitting: Internal Medicine

## 2021-09-01 ENCOUNTER — Encounter: Payer: Self-pay | Admitting: Internal Medicine

## 2021-09-01 ENCOUNTER — Ambulatory Visit (INDEPENDENT_AMBULATORY_CARE_PROVIDER_SITE_OTHER): Payer: 59 | Admitting: Internal Medicine

## 2021-09-01 ENCOUNTER — Other Ambulatory Visit: Payer: Self-pay

## 2021-09-01 VITALS — BP 132/84 | HR 73 | Ht 67.0 in | Wt 196.0 lb

## 2021-09-01 DIAGNOSIS — J4520 Mild intermittent asthma, uncomplicated: Secondary | ICD-10-CM

## 2021-09-01 DIAGNOSIS — Z1231 Encounter for screening mammogram for malignant neoplasm of breast: Secondary | ICD-10-CM

## 2021-09-01 DIAGNOSIS — F324 Major depressive disorder, single episode, in partial remission: Secondary | ICD-10-CM

## 2021-09-01 DIAGNOSIS — Z0000 Encounter for general adult medical examination without abnormal findings: Secondary | ICD-10-CM

## 2021-09-01 NOTE — Progress Notes (Signed)
Date:  09/01/2021   Name:  Casey Marquez   DOB:  [birth year]   MRN:  [id number]   Chief Complaint: No chief complaint on file. Casey Marquez is a 61 y.o. female who presents today for her Complete Annual Exam. She feels well. She reports exercising- none. She reports she is sleeping fairly well. Breast complaints - none.  She continues on progesterone to reduce menopausal sx.  Mammogram: 11/2016 DEXA: none Pap smear: 10/2016 neg with co-testing; now s/p hysterectomy Colonoscopy: none  Immunization History  Administered Date(s) Administered   Influenza,inj,Quad PF,6+ Mos 08/26/2017   PFIZER(Purple Top)SARS-COV-2 Vaccination 11/13/2019, 12/04/2019   Tdap 06/23/2010    Depression        This is a chronic problem.The problem is unchanged.  Associated symptoms include no fatigue and no headaches.  Past treatments include SSRIs - Selective serotonin reuptake inhibitors.  Compliance with treatment is good. Asthma There is no cough, shortness of breath or wheezing. This is a recurrent problem. The problem occurs intermittently. Pertinent negatives include no chest pain, fever, headaches or trouble swallowing. Her symptoms are alleviated by steroid inhaler and beta-agonist. Her past medical history is significant for asthma.  Gastroesophageal Reflux She reports no abdominal pain, no chest pain, no coughing or no wheezing. Pertinent negatives include no fatigue. She has tried a PPI for the symptoms. The treatment provided significant relief.   Lab Results  Component Value Date   NA 142 04/11/2018   K 3.7 04/11/2018   CO2 29 04/11/2018   GLUCOSE 98 04/11/2018   BUN 28 (H) 04/11/2018   CREATININE 0.78 04/11/2018   CALCIUM 9.2 04/11/2018   GFRNONAA >60 04/11/2018   Lab Results  Component Value Date   CHOL 234 (H) 11/11/2016   HDL 55 11/11/2016   LDLCALC 156 (H) 11/11/2016   TRIG 115 11/11/2016   CHOLHDL 4.3 11/11/2016   Lab Results  Component Value Date   TSH 4.220 11/11/2016    No results found for: HGBA1C Lab Results  Component Value Date   WBC 8.4 04/11/2018   HGB 13.6 04/11/2018   HCT 41.0 04/11/2018   MCV 87.6 04/11/2018   PLT 298 04/11/2018   Lab Results  Component Value Date   ALT 8 11/11/2016   AST 20 11/11/2016   ALKPHOS 76 11/11/2016   BILITOT 0.4 11/11/2016   No results found for: 25OHVITD2, 25OHVITD3, VD25OH   Review of Systems  Constitutional:  Negative for chills, fatigue and fever.  HENT:  Negative for congestion, hearing loss, tinnitus, trouble swallowing and voice change.   Eyes:  Negative for visual disturbance.  Respiratory:  Negative for cough, chest tightness, shortness of breath and wheezing.   Cardiovascular:  Negative for chest pain, palpitations and leg swelling.  Gastrointestinal:  Negative for abdominal pain, constipation, diarrhea and vomiting.  Endocrine: Negative for polydipsia and polyuria.  Genitourinary:  Negative for dysuria, frequency, genital sores, vaginal bleeding and vaginal discharge.  Musculoskeletal:  Negative for arthralgias, gait problem and joint swelling.  Skin:  Negative for color change and rash.  Neurological:  Negative for dizziness, tremors, light-headedness and headaches.  Hematological:  Negative for adenopathy. Does not bruise/bleed easily.  Psychiatric/Behavioral:  Positive for depression. Negative for dysphoric mood and sleep disturbance. The patient is not nervous/anxious.    Patient Active Problem List   Diagnosis Date Noted   Anxiety disorder due to known physiological condition 03/21/2020   Mild intermittent asthma without complication 86/57/8469   Gastroesophageal reflux disease 06/28/2017   Environmental  and seasonal allergies 01/11/2015   Depression, major, single episode, in partial remission ([place]) 01/11/2015   Hot flash, menopausal 01/11/2015   Nerve pain 01/11/2015    Allergies  Allergen Reactions   Tegretol  [Carbamazepine]     confusion   Other Nausea Only    ALL OPIODS  MAKE PT NAUSEATED   Sertraline Nausea Only   Tape Rash    Past Surgical History:  Procedure Laterality Date   ABDOMINAL HYSTERECTOMY     EXCISIONAL HEMORRHOIDECTOMY  2002   fibroidectomy  1990   GANGLION CYST EXCISION  2007   INGUINAL HERNIA REPAIR Left 2015   LAPAROSCOPIC BILATERAL SALPINGO OOPHERECTOMY Bilateral 04/17/2018   Procedure: LAPAROSCOPIC BILATERAL SALPINGO OOPHORECTOMY;  Surgeon: Ward, Honor Loh, MD;  Location: [facility] ORS;  Service: Gynecology;  Laterality: Bilateral;   LAPAROSCOPIC HYSTERECTOMY N/A 04/17/2018   Procedure: HYSTERECTOMY TOTAL LAPAROSCOPIC;  Surgeon: Ward, Honor Loh, MD;  Location: [facility] ORS;  Service: Gynecology;  Laterality: N/A;   TENDON REPAIR Right     Social History   Tobacco Use   Smoking status: Never   Smokeless tobacco: Never  Vaping Use   Vaping Use: Never used  Substance Use Topics   Alcohol use: Yes    Alcohol/week: 0.0 standard drinks    Comment: EVERY OTHER DAY   Drug use: No     Medication list has been reviewed and updated.  Current Meds  Medication Sig   Amino Acids (TRIAMINO) TABS Take 2 tablets by mouth every evening.   Budesonide (PULMICORT FLEXHALER) 90 MCG/ACT inhaler Inhale 1 puff into the lungs twice daily. (Patient taking differently: Inhale 1 puff into the lungs daily.)   cetirizine (ZYRTEC) 10 MG tablet Take 10 mg by mouth daily as needed for allergies.   Cholecalciferol (VITAMIN D3) 10000 units capsule Take 10,000 Units by mouth every evening.   Dextrose-Fructose-Sod Citrate [id number] MG CHEW Chew 1-2 tablets by mouth daily as needed (nausea).   dicyclomine (BENTYL) 20 MG tablet Take 1 tablet (20 mg total) by mouth 3 (three) times daily before meals. PRN   FLUoxetine (PROZAC) 20 MG tablet Take 1 tablet by mouth once daily.   fluticasone (FLONASE) 50 MCG/ACT nasal spray Place 2 sprays into both nostrils daily as needed for allergies.    Hydrocortisone Acetate 1 % OINT Apply 1 application topically 4 (four) times  daily as needed.   ibuprofen (ADVIL,MOTRIN) 800 MG tablet Take 1 tablet (800 mg total) by mouth every 6 (six) hours.   LORazepam (ATIVAN) 0.5 MG tablet Take 0.5 tablets (0.25 mg total) by mouth daily as needed for anxiety.   Magnesium 400 MG TABS Take 400 mg by mouth every evening.   Melatonin 5 MG TABS Take 5 mg by mouth at bedtime.   Multiple Vitamins-Minerals (AIRBORNE) CHEW Chew 1 tablet by mouth daily as needed (immune support).   OIL OF OREGANO PO Take 1 capsule by mouth daily as needed (immune support).   omeprazole (PRILOSEC) 20 MG capsule Take 1 capsule (20 mg total) by mouth daily. (Patient taking differently: Take 20 mg by mouth every evening.)   progesterone (PROMETRIUM) 200 MG capsule Take 1 capsule by mouth at bedtime.   tiZANidine (ZANAFLEX) 4 MG tablet Take 1/2 to 1 tablet (2 to 4 mg total) by mouth daily as needed for muscle spasms.    PHQ 2/9 Scores 09/01/2021 07/07/2021 12/31/2020 03/21/2020  PHQ - 2 Score 0 2 6 4   PHQ- 9 Score 2 4 12 8   Exception Documentation - - - -  Not completed - - - -    GAD 7 : Generalized Anxiety Score 09/01/2021 07/07/2021 12/31/2020 03/21/2020  Nervous, Anxious, on Edge 0 1 2 3   Control/stop worrying 0 0 2 3  Worry too much - different things 0 0 2 3  Trouble relaxing 0 1 2 1   Restless 0 0 1 1  Easily annoyed or irritable 0 1 1 1   Afraid - awful might happen 0 0 1 0  Total GAD 7 Score 0 3 11 12   Anxiety Difficulty Not difficult at all Not difficult at all Somewhat difficult Somewhat difficult    BP Readings from Last 3 Encounters:  09/01/21 132/84  07/07/21 126/76  12/31/20 104/60    Physical Exam Vitals and nursing note reviewed.  Constitutional:      General: She is not in acute distress.    Appearance: She is well-developed.  HENT:     Head: Normocephalic and atraumatic.     Right Ear: Tympanic membrane and ear canal normal.     Left Ear: Tympanic membrane and ear canal normal.     Nose:     Right Sinus: No maxillary sinus  tenderness.     Left Sinus: No maxillary sinus tenderness.  Eyes:     General: No scleral icterus.       Right eye: No discharge.        Left eye: No discharge.     Conjunctiva/sclera: Conjunctivae normal.  Neck:     Thyroid: No thyromegaly.     Vascular: No carotid bruit.  Cardiovascular:     Rate and Rhythm: Normal rate and regular rhythm.     Pulses: Normal pulses.     Heart sounds: Normal heart sounds.  Pulmonary:     Effort: Pulmonary effort is normal. No respiratory distress.     Breath sounds: No wheezing.  Chest:  Breasts:    Right: No mass, nipple discharge, skin change or tenderness.     Left: No mass, nipple discharge, skin change or tenderness.  Abdominal:     General: Bowel sounds are normal.     Palpations: Abdomen is soft.     Tenderness: There is no abdominal tenderness.  Musculoskeletal:     Cervical back: Normal range of motion. No erythema.     Right lower leg: No edema.     Left lower leg: No edema.  Lymphadenopathy:     Cervical: No cervical adenopathy.  Skin:    General: Skin is warm and dry.     Findings: No rash.  Neurological:     Mental Status: She is alert and oriented to person, place, and time.     Cranial Nerves: No cranial nerve deficit.     Sensory: No sensory deficit.     Deep Tendon Reflexes: Reflexes are normal and symmetric.  Psychiatric:        Attention and Perception: Attention normal.        Mood and Affect: Mood normal.    Wt Readings from Last 3 Encounters:  09/01/21 196 lb (88.9 kg)  07/07/21 200 lb (90.7 kg)  12/31/20 206 lb (93.4 kg)    BP 132/84    Pulse 73    Ht 5\' 7"  (1.702 m)    Wt 196 lb (88.9 kg)    LMP 04/10/2008    SpO2 98%    BMI 30.70 kg/m   Assessment and Plan: 1. Annual physical exam Normal exam. She declines pneumonia vaccine and Tdap today due to insurance  issues She declines CRC screening. She will return for Shingrix at a later date. - Comprehensive metabolic panel - Hemoglobin A1c - Lipid  panel  2. Encounter for screening mammogram for breast cancer Mammogram scheduled - sister with breast cancer. - MM 3D SCREEN BREAST BILATERAL  3. Mild intermittent asthma without complication Doing well with daily Budesonide; no recent chest infection sx although she thinks there may be a mold issue in her home.  I encourage her to get an air quality test. - CBC with Differential/Platelet  4. Depression, major, single episode, in partial remission (HCC) Clinically stable on current regimen with good control of symptoms, No SI or HI. Will continue current therapy with Prozac. - TSH   Partially dictated using Editor, commissioning. Any errors are unintentional.  Halina Maidens, MD [facility]  09/01/2021

## 2021-09-02 ENCOUNTER — Encounter: Payer: Self-pay | Admitting: Internal Medicine

## 2021-09-02 DIAGNOSIS — E782 Mixed hyperlipidemia: Secondary | ICD-10-CM | POA: Insufficient documentation

## 2021-09-02 LAB — COMPREHENSIVE METABOLIC PANEL
ALT: 6 IU/L (ref 0–32)
AST: 20 IU/L (ref 0–40)
Albumin/Globulin Ratio: 1.8 (ref 1.2–2.2)
Albumin: 4.7 g/dL (ref 3.8–4.9)
Alkaline Phosphatase: 98 IU/L (ref 44–121)
BUN/Creatinine Ratio: 25 — ABNORMAL HIGH (ref 9–23)
BUN: 22 mg/dL (ref 6–24)
Bilirubin Total: 0.3 mg/dL (ref 0.0–1.2)
CO2: 25 mmol/L (ref 20–29)
Calcium: 9.7 mg/dL (ref 8.7–10.2)
Chloride: 104 mmol/L (ref 96–106)
Creatinine, Ser: 0.88 mg/dL (ref 0.57–1.00)
Globulin, Total: 2.6 g/dL (ref 1.5–4.5)
Glucose: 104 mg/dL — ABNORMAL HIGH (ref 70–99)
Potassium: 4.4 mmol/L (ref 3.5–5.2)
Sodium: 144 mmol/L (ref 134–144)
Total Protein: 7.3 g/dL (ref 6.0–8.5)
eGFR: 76 mL/min/{1.73_m2} (ref >59)

## 2021-09-02 LAB — LIPID PANEL
Chol/HDL Ratio: 5 ratio — ABNORMAL HIGH (ref 0.0–4.4)
Cholesterol, Total: 244 mg/dL — ABNORMAL HIGH (ref 100–199)
HDL: 49 mg/dL (ref >39)
LDL Chol Calc (NIH): 175 mg/dL — ABNORMAL HIGH (ref 0–99)
Triglycerides: 113 mg/dL (ref 0–149)
VLDL Cholesterol Cal: 20 mg/dL (ref 5–40)

## 2021-09-02 LAB — CBC WITH DIFFERENTIAL/PLATELET
Basophils Absolute: 0.1 10*3/uL (ref 0.0–0.2)
Basos: 1 %
EOS (ABSOLUTE): 0.2 10*3/uL (ref 0.0–0.4)
Eos: 3 %
Hematocrit: 41.6 % (ref 34.0–46.6)
Hemoglobin: 14.1 g/dL (ref 11.1–15.9)
Immature Grans (Abs): 0 10*3/uL (ref 0.0–0.1)
Immature Granulocytes: 0 %
Lymphocytes Absolute: 3.3 10*3/uL — ABNORMAL HIGH (ref 0.7–3.1)
Lymphs: 38 %
MCH: 29.4 pg (ref 26.6–33.0)
MCHC: 33.9 g/dL (ref 31.5–35.7)
MCV: 87 fL (ref 79–97)
Monocytes Absolute: 0.5 10*3/uL (ref 0.1–0.9)
Monocytes: 6 %
Neutrophils Absolute: 4.4 10*3/uL (ref 1.4–7.0)
Neutrophils: 52 %
Platelets: 352 10*3/uL (ref 150–450)
RBC: 4.8 x10E6/uL (ref 3.77–5.28)
RDW: 13 % (ref 11.7–15.4)
WBC: 8.5 10*3/uL (ref 3.4–10.8)

## 2021-09-02 LAB — HEMOGLOBIN A1C
Est. average glucose Bld gHb Est-mCnc: 114 mg/dL
Hgb A1c MFr Bld: 5.6 % (ref 4.8–5.6)

## 2021-09-02 LAB — TSH: TSH: 3.49 u[IU]/mL (ref 0.450–4.500)

## 2021-10-05 ENCOUNTER — Other Ambulatory Visit: Payer: Self-pay | Admitting: Internal Medicine

## 2021-10-05 DIAGNOSIS — J4520 Mild intermittent asthma, uncomplicated: Secondary | ICD-10-CM

## 2021-10-06 NOTE — Telephone Encounter (Signed)
Refill if appropriate.  Please advise.  

## 2021-10-06 NOTE — Telephone Encounter (Signed)
Requested medication (s) are due for refill today - yes  Requested medication (s) are on the active medication list -yes  Future visit scheduled -no  Last refill: 07/07/21 #60 2RF  Notes to clinic: Request RF: non delegated Rx  Requested Prescriptions  Pending Prescriptions Disp Refills   MUCINEX D 60-600 MG 12 hr tablet [Pharmacy Med Name: Mucinex D 600mg -60mg  Extended-Release Tablet] 60 tablet 1    Sig: Take 1 tablet by mouth every 12 hours.     Not Delegated - Ear, Nose, and Throat:  Combination Products with Pseudoephedrine Failed - 10/05/2021  3:50 PM      Failed - This refill cannot be delegated      Passed - Last BP in normal range    BP Readings from Last 1 Encounters:  09/01/21 132/84          Passed - Valid encounter within last 12 months    Recent Outpatient Visits           1 month ago Annual physical exam   [facility] Glean Hess, MD   3 months ago Acute non-recurrent maxillary sinusitis   [facility] Glean Hess, MD   9 months ago Depression, major, single episode, in partial remission [facility])   [facility] Glean Hess, MD   1 year ago Environmental and seasonal allergies   [facility] Glean Hess, MD   2 years ago Acute non-recurrent maxillary sinusitis   [facility] Glean Hess, MD                 Requested Prescriptions  Pending Prescriptions Disp Refills   MUCINEX D 60-600 MG 12 hr tablet [Pharmacy Med Name: Mucinex D 600mg -60mg  Extended-Release Tablet] 60 tablet 1    Sig: Take 1 tablet by mouth every 12 hours.     Not Delegated - Ear, Nose, and Throat:  Combination Products with Pseudoephedrine Failed - 10/05/2021  3:50 PM      Failed - This refill cannot be delegated      Passed - Last BP in normal range    BP Readings from Last 1 Encounters:  09/01/21 132/84          Passed - Valid encounter within last 12 months    Recent Outpatient Visits           1  month ago Annual physical exam   [facility] Glean Hess, MD   3 months ago Acute non-recurrent maxillary sinusitis   [facility] Glean Hess, MD   9 months ago Depression, major, single episode, in partial remission [facility])   [facility] Glean Hess, MD   1 year ago Environmental and seasonal allergies   [facility] Glean Hess, MD   2 years ago Acute non-recurrent maxillary sinusitis   [facility] Glean Hess, MD

## 2021-11-01 ENCOUNTER — Other Ambulatory Visit: Payer: Self-pay | Admitting: Internal Medicine

## 2021-11-01 DIAGNOSIS — F324 Major depressive disorder, single episode, in partial remission: Secondary | ICD-10-CM

## 2021-11-02 NOTE — Telephone Encounter (Signed)
Requested Prescriptions  ?Pending Prescriptions Disp Refills  ?? FLUoxetine (PROZAC) 20 MG tablet [Pharmacy Med Name: Fluoxetine Hydrochloride '20mg'$  Tablet] 90 tablet 1  ?  Sig: Take 1 tablet by mouth once daily.  ?  ? Psychiatry:  Antidepressants - SSRI Passed - 11/01/2021  5:33 PM  ?  ?  Passed - Completed PHQ-2 or PHQ-9 in the last 360 days  ?  ?  Passed - Valid encounter within last 6 months  ?  Recent Outpatient Visits   ?      ? 2 months ago Annual physical exam  ? [facility] Glean Hess, MD  ? 3 months ago Acute non-recurrent maxillary sinusitis  ? [facility] Glean Hess, MD  ? 10 months ago Depression, major, single episode, in partial remission [facility])  ? [facility] Glean Hess, MD  ? 1 year ago Environmental and seasonal allergies  ? [facility] Glean Hess, MD  ? 2 years ago Acute non-recurrent maxillary sinusitis  ? [facility] Glean Hess, MD  ?  ?  ? ?  ?  ?  ?? progesterone (PROMETRIUM) 200 MG capsule [Pharmacy Med Name: Progesterone '200mg'$  Capsule] 90 capsule 3  ?  Sig: Take 1 capsule by mouth at bedtime.  ?  ? OB/GYN:  Progestins Passed - 11/01/2021  5:33 PM  ?  ?  Passed - Last BP in normal range  ?  BP Readings from Last 1 Encounters:  ?09/01/21 132/84  ?   ?  ?  Passed - Valid encounter within last 12 months  ?  Recent Outpatient Visits   ?      ? 2 months ago Annual physical exam  ? [facility] Glean Hess, MD  ? 3 months ago Acute non-recurrent maxillary sinusitis  ? [facility] Glean Hess, MD  ? 10 months ago Depression, major, single episode, in partial remission [facility])  ? [facility] Glean Hess, MD  ? 1 year ago Environmental and seasonal allergies  ? [facility] Glean Hess, MD  ? 2 years ago Acute non-recurrent maxillary sinusitis  ? [facility] Glean Hess, MD  ?  ?  ? ?  ?  ?  Passed - Patient is not a smoker  ?  ?  ? ?

## 2021-11-23 ENCOUNTER — Inpatient Hospital Stay: Admission: RE | Admit: 2021-11-23 | Payer: 59 | Source: Ambulatory Visit

## 2021-11-25 DIAGNOSIS — Z90710 Acquired absence of both cervix and uterus: Secondary | ICD-10-CM | POA: Insufficient documentation

## 2021-12-10 ENCOUNTER — Other Ambulatory Visit: Payer: Self-pay | Admitting: Internal Medicine

## 2021-12-10 DIAGNOSIS — J4520 Mild intermittent asthma, uncomplicated: Secondary | ICD-10-CM

## 2021-12-10 DIAGNOSIS — M62838 Other muscle spasm: Secondary | ICD-10-CM

## 2021-12-11 NOTE — Telephone Encounter (Signed)
Requested medication (s) are due for refill today: This is an old rx/dose, has newer rx for this med ? ?Requested medication (s) are on the active medication list: not at this dose/signature ? ?Last refill:  na ? ?Future visit scheduled: no, seen 09/01/21 ? ?Notes to clinic:  This medication can not be delegated, please assess.  ? ? ? ?  ? ?Requested Prescriptions  ?Pending Prescriptions Disp Refills  ? tiZANidine (ZANAFLEX) 4 MG tablet [Pharmacy Med Name: Tizanidine Hydrochloride '4mg'$  Tablet] 90 tablet   ?  Sig: Take 1 tablet (4 mg total) by mouth every 8 hours as needed for muscle spasms.  ?  ? Not Delegated - Cardiovascular:  Alpha-2 Agonists - tizanidine Failed - 12/10/2021 12:03 PM  ?  ?  Failed - This refill cannot be delegated  ?  ?  Passed - Valid encounter within last 6 months  ?  Recent Outpatient Visits   ? ?      ? 3 months ago Annual physical exam  ? [facility] Glean Hess, MD  ? 5 months ago Acute non-recurrent maxillary sinusitis  ? [facility] Glean Hess, MD  ? 11 months ago Depression, major, single episode, in partial remission [facility])  ? [facility] Glean Hess, MD  ? 1 year ago Environmental and seasonal allergies  ? [facility] Glean Hess, MD  ? 2 years ago Acute non-recurrent maxillary sinusitis  ? [facility] Glean Hess, MD  ? ?  ?  ? ? ?  ?  ?  ?Signed Prescriptions Disp Refills  ? Budesonide (PULMICORT FLEXHALER) 90 MCG/ACT inhaler 3 each 0  ?  Sig: Inhale 1 puff by mouth into the lungs twice daily.  ?  ? Pulmonology:  Corticosteroids Passed - 12/10/2021 12:03 PM  ?  ?  Passed - Valid encounter within last 12 months  ?  Recent Outpatient Visits   ? ?      ? 3 months ago Annual physical exam  ? [facility] Glean Hess, MD  ? 5 months ago Acute non-recurrent maxillary sinusitis  ? [facility] Glean Hess, MD  ? 11 months ago Depression, major, single episode, in partial remission [facility])   ? [facility] Glean Hess, MD  ? 1 year ago Environmental and seasonal allergies  ? [facility] Glean Hess, MD  ? 2 years ago Acute non-recurrent maxillary sinusitis  ? [facility] Glean Hess, MD  ? ?  ?  ? ? ?  ?  ?  ? ? ?

## 2021-12-11 NOTE — Telephone Encounter (Signed)
Requested Prescriptions  ?Pending Prescriptions Disp Refills  ?? tiZANidine (ZANAFLEX) 4 MG tablet [Pharmacy Med Name: Tizanidine Hydrochloride '4mg'$  Tablet] 90 tablet   ?  Sig: Take 1 tablet (4 mg total) by mouth every 8 hours as needed for muscle spasms.  ?  ? Not Delegated - Cardiovascular:  Alpha-2 Agonists - tizanidine Failed - 12/10/2021 12:03 PM  ?  ?  Failed - This refill cannot be delegated  ?  ?  Passed - Valid encounter within last 6 months  ?  Recent Outpatient Visits   ?      ? 3 months ago Annual physical exam  ? [facility] Glean Hess, MD  ? 5 months ago Acute non-recurrent maxillary sinusitis  ? [facility] Glean Hess, MD  ? 11 months ago Depression, major, single episode, in partial remission [facility])  ? [facility] Glean Hess, MD  ? 1 year ago Environmental and seasonal allergies  ? [facility] Glean Hess, MD  ? 2 years ago Acute non-recurrent maxillary sinusitis  ? [facility] Glean Hess, MD  ?  ?  ? ?  ?  ?  ?? Budesonide (PULMICORT FLEXHALER) 90 MCG/ACT inhaler [Pharmacy Med Name: Pulmicort Flexhaler 48mg/actuation Powder for Inhalation] 3 each 0  ?  Sig: Inhale 1 puff by mouth into the lungs twice daily.  ?  ? Pulmonology:  Corticosteroids Passed - 12/10/2021 12:03 PM  ?  ?  Passed - Valid encounter within last 12 months  ?  Recent Outpatient Visits   ?      ? 3 months ago [facility]  [facility]  ? 5 months ago Acute non-recurrent maxillary sinusitis  ? [facility]  ? 11 months ago Depression, major, single episode, in partial remission ([facility]  ? [facility]  ? 1 year ago Environmental and seasonal allergies  ? [facility]  ? 2 years ago Acute non-recurrent maxillary sinusitis  ? [facility]  ?  ?  ? ?  ?  ?  ? ? ?

## 2022-02-15 ENCOUNTER — Ambulatory Visit
Admission: EM | Admit: 2022-02-15 | Discharge: 2022-02-15 | Disposition: A | Discharge status: Home / Self Care | Payer: Self-pay | Attending: Emergency Medicine | Admitting: Emergency Medicine

## 2022-02-15 DIAGNOSIS — J0100 Acute maxillary sinusitis, unspecified: Secondary | ICD-10-CM

## 2022-02-15 MED ORDER — AMOXICILLIN-POT CLAVULANATE 875-125 MG PO TABS: 1.0000 | ORAL_TABLET | Freq: Two times a day (BID) | ORAL | 0 refills | Status: AC

## 2022-02-15 NOTE — ED Triage Notes (Signed)
Patient presents to Urgent Care with complaints of head and facial pressure since 3 weeks. Has a Hx if allergies. Treating with mucinex D. Concerned with sinus infection.

## 2022-02-19 ENCOUNTER — Ambulatory Visit: Payer: Self-pay | Admitting: Internal Medicine

## 2022-02-20 ENCOUNTER — Other Ambulatory Visit: Payer: Self-pay | Admitting: Internal Medicine

## 2022-02-20 DIAGNOSIS — M62838 Other muscle spasm: Secondary | ICD-10-CM

## 2022-02-22 ENCOUNTER — Other Ambulatory Visit: Payer: Self-pay

## 2022-02-22 NOTE — Telephone Encounter (Signed)
Requested medication (s) are due for refill today:   Provider to review  Requested medication (s) are on the active medication list:   Jolyne Loa   Last ordered: 12/11/2021 #30, 0 refills  Non delegated refill   Requested Prescriptions  Pending Prescriptions Disp Refills   tiZANidine (ZANAFLEX) 4 MG tablet [Pharmacy Med Name: Tizanidine Hydrochloride '4mg'$  Tablet] 30 tablet 0    Sig: Take 1 tablet by mouth every 8 hours as needed for muscle spasms.     Not Delegated - Cardiovascular:  Alpha-2 Agonists - tizanidine Failed - 02/20/2022  4:24 PM      Failed - This refill cannot be delegated      Passed - Valid encounter within last 6 months    Recent Outpatient Visits           5 months ago Annual physical exam   [facility] Glean Hess, MD   7 months ago Acute non-recurrent maxillary sinusitis   [facility] Glean Hess, MD   1 year ago Depression, major, single episode, in partial remission [facility])   [facility] Gle[facility], [facility]   1 year ago Environmental and seasonal allergies   [facility] Gle[facility] Hess, MD   2 years ago Acute non-recurrent maxillary sinusitis   [facility] Glean Hess, MD

## 2022-03-02 ENCOUNTER — Encounter: Payer: Self-pay | Admitting: Internal Medicine

## 2022-03-03 ENCOUNTER — Encounter: Payer: Self-pay | Admitting: Family Medicine

## 2022-03-03 ENCOUNTER — Ambulatory Visit (INDEPENDENT_AMBULATORY_CARE_PROVIDER_SITE_OTHER): Payer: Self-pay | Admitting: Family Medicine

## 2022-03-03 VITALS — BP 112/76 | HR 96 | Ht 67.0 in | Wt 191.0 lb

## 2022-03-03 DIAGNOSIS — J0100 Acute maxillary sinusitis, unspecified: Secondary | ICD-10-CM

## 2022-03-03 MED ORDER — AMOXICILLIN-POT CLAVULANATE 875-125 MG PO TABS: 1.0000 | ORAL_TABLET | Freq: Two times a day (BID) | ORAL | 1 refills | Status: DC

## 2022-03-03 NOTE — Progress Notes (Signed)
Date:  03/03/2022   Name:  Casey Marquez   DOB:  May 21, 1961   MRN:  [id number]   Chief Complaint: Sinusitis  Sinusitis This is a recurrent problem. The current episode started 1 to 4 weeks ago (2 weeks). The problem has been gradually improving since onset. Maximum temperature: low grade. Associated symptoms include congestion, coughing, ear pain, headaches, sinus pressure and a sore throat. Pertinent negatives include no chills, diaphoresis, hoarse voice, shortness of breath, sneezing or swollen glands. Past treatments include antibiotics (augmentin). The treatment provided mild relief.    Lab Results  Component Value Date   NA 144 09/01/2021   K 4.4 09/01/2021   CO2 25 09/01/2021   GLUCOSE 104 (H) 09/01/2021   BUN 22 09/01/2021   CREATININE 0.88 09/01/2021   CALCIUM 9.7 09/01/2021   EGFR 76 09/01/2021   GFRNONAA >60 04/11/2018   Lab Results  Component Value Date   CHOL 244 (H) 09/01/2021   HDL 49 09/01/2021   LDLCALC 175 (H) 09/01/2021   TRIG 113 09/01/2021   CHOLHDL 5.0 (H) 09/01/2021   Lab Results  Component Value Date   TSH 3.490 09/01/2021   Lab Results  Component Value Date   HGBA1C 5.6 09/01/2021   Lab Results  Component Value Date   WBC 8.5 09/01/2021   HGB 14.1 09/01/2021   HCT 41.6 09/01/2021   MCV 87 09/01/2021   PLT 352 09/01/2021   Lab Results  Component Value Date   ALT 6 09/01/2021   AST 20 09/01/2021   ALKPHOS 98 09/01/2021   BILITOT 0.3 09/01/2021   No results found for: "25OHVITD2", "25OHVITD3", "VD25OH"   Review of Systems  Constitutional:  Negative for chills, diaphoresis and fever.  HENT:  Positive for congestion, ear pain, sinus pressure and sore throat. Negative for hoarse voice and sneezing.   Respiratory:  Positive for cough. Negative for shortness of breath.   Neurological:  Positive for headaches.    Patient Active Problem List   Diagnosis Date Noted   Status post total abdominal hysterectomy 11/25/2021   Mixed  hyperlipidemia 09/02/2021   Anxiety disorder due to known physiological condition 03/21/2020   Mild intermittent asthma without complication 35/36/1443   Gastroesophageal reflux disease 06/28/2017   Environmental and seasonal allergies 01/11/2015   Depression, major, single episode, in partial remission ([place]) 01/11/2015   Hot flash, menopausal 01/11/2015   Nerve pain 01/11/2015    Allergies  Allergen Reactions   Tegretol  [Carbamazepine]     confusion   Other Nausea Only    ALL OPIODS MAKE PT NAUSEATED   Sertraline Nausea Only   Tape Rash    Past Surgical History:  Procedure Laterality Date   EXCISIONAL HEMORRHOIDECTOMY  2002   fibroidectomy  1990   GANGLION CYST EXCISION  2007   INGUINAL HERNIA REPAIR Left 2015   LAPAROSCOPIC BILATERAL SALPINGO OOPHERECTOMY Bilateral 04/17/2018   Procedure: LAPAROSCOPIC BILATERAL SALPINGO OOPHORECTOMY;  Surgeon: Ward, Honor Loh, MD;  Location: [facility] ORS;  Service: Gynecology;  Laterality: Bilateral;   LAPAROSCOPIC HYSTERECTOMY N/A 04/17/2018   Procedure: HYSTERECTOMY TOTAL LAPAROSCOPIC;  Surgeon: Ward, Honor Loh, MD;  Location: [facility] ORS;  Service: Gynecology;  Laterality: N/A;   TENDON REPAIR Right    TOTAL ABDOMINAL HYSTERECTOMY      Social History   Tobacco Use   Smoking status: Never   Smokeless tobacco: Never  Vaping Use   Vaping Use: Never used  Substance Use Topics   Alcohol use: Yes    Alcohol/week:  0.0 standard drinks of alcohol    Comment: EVERY OTHER DAY   Drug use: No     Medication list has been reviewed and updated.  Current Meds  Medication Sig   Amino Acids (TRIAMINO) TABS Take 2 tablets by mouth every evening.   Budesonide (PULMICORT FLEXHALER) 90 MCG/ACT inhaler Inhale 1 puff by mouth into the lungs twice daily.   cetirizine (ZYRTEC) 10 MG tablet Take 10 mg by mouth daily as needed for allergies.   Cholecalciferol (VITAMIN D3) 10000 units capsule Take 10,000 Units by mouth every evening.    Dextrose-Fructose-Sod Citrate [phone] MG CHEW Chew 1-2 tablets by mouth daily as needed (nausea).   dicyclomine (BENTYL) 20 MG tablet Take 1 tablet (20 mg total) by mouth 3 (three) times daily before meals. PRN   FLUoxetine (PROZAC) 20 MG tablet Take 1 tablet by mouth once daily.   fluticasone (FLONASE) 50 MCG/ACT nasal spray Place 2 sprays into both nostrils daily as needed for allergies.    Hydrocortisone Acetate 1 % OINT Apply 1 application topically 4 (four) times daily as needed.   levalbuterol (XOPENEX HFA) 45 MCG/ACT inhaler Inhale 2 puffs into the lungs every 6 (six) hours as needed for wheezing.   LORazepam (ATIVAN) 0.5 MG tablet Take 0.5 tablets (0.25 mg total) by mouth daily as needed for anxiety.   Magnesium 400 MG TABS Take 400 mg by mouth every evening.   Melatonin 5 MG TABS Take 5 mg by mouth at bedtime.   MUCINEX D 60-600 MG 12 hr tablet Take 1 tablet by mouth every 12 hours.   Multiple Vitamins-Minerals (AIRBORNE) CHEW Chew 1 tablet by mouth daily as needed (immune support).   OIL OF OREGANO PO Take 1 capsule by mouth daily as needed (immune support).   omeprazole (PRILOSEC) 20 MG capsule Take 1 capsule (20 mg total) by mouth daily. (Patient taking differently: Take 20 mg by mouth every evening.)   OVER THE COUNTER MEDICATION Take 1 mL by mouth daily as needed (mood / memory). Deprenyl otc supplement   progesterone (PROMETRIUM) 200 MG capsule Take 1 capsule by mouth at bedtime.   tiZANidine (ZANAFLEX) 4 MG tablet Take 1 tablet by mouth every 8 hours as needed for muscle spasms.       03/03/2022    1:15 PM 09/01/2021   11:00 AM 07/07/2021    3:38 PM 12/31/2020    1:37 PM  GAD 7 : Generalized Anxiety Score  Nervous, Anxious, on Edge 0 0 1 2  Control/stop worrying 0 0 0 2  Worry too much - different things 0 0 0 2  Trouble relaxing 0 0 1 2  Restless 0 0 0 1  Easily annoyed or irritable 1 0 1 1  Afraid - awful might happen 0 0 0 1  Total GAD 7 Score 1 0 3 11  Anxiety  Difficulty Not difficult at all Not difficult at all Not difficult at all Somewhat difficult       03/03/2022    1:10 PM 09/01/2021   11:00 AM 07/07/2021    3:38 PM  Depression screen PHQ 2/9  Decreased Interest 1 0 1  Down, Depressed, Hopeless 1 0 1  PHQ - 2 Score 2 0 2  Altered sleeping 1 0 1  Tired, decreased energy _0 Change in appetite 0 0 0  Feeling bad or failure about yourself   0 0  Trouble concentrating 0 0 0  Moving slowly or fidgety/restless 0 0 0  Suicidal thoughts 0 0 0  PHQ-9 Score _0 Difficult doing work/chores Not difficult at all Not difficult at all Not difficult at all    BP Readings from Last 3 Encounters:  03/03/22 112/76  02/15/22 119/83  09/01/21 132/84    Physical Exam Vitals and nursing note reviewed.  HENT:     Right Ear: Tympanic membrane and ear canal normal. There is no impacted cerumen.     Left Ear: Tympanic membrane and ear canal normal. There is no impacted cerumen.     Nose: Congestion present.     Right Turbinates: Swollen.     Left Turbinates: Swollen.     Right Sinus: Maxillary sinus tenderness present.     Left Sinus: Maxillary sinus tenderness present.     Mouth/Throat:     Mouth: Mucous membranes are moist.     Pharynx: No posterior oropharyngeal erythema.  Neurological:     Mental Status: She is alert.     Wt Readings from Last 3 Encounters:  03/03/22 191 lb (86.6 kg)  09/01/21 196 lb (88.9 kg)  07/07/21 200 lb (90.7 kg)    BP 112/76   Pulse 96   Ht _1  (1.702 m)   Wt 191 lb (86.6 kg)   LMP 04/10/2008   SpO2 96%   BMI 29.91 kg/m   Assessment and Plan:  1. Acute maxillary sinusitis, recurrence not specified New onset.  Partially treated or recurrence.  Stable.  Symptomatology and exam is consistent with a maxillary sinusitis.  Discussion with patient about saline lavage, decongestant with mucolytic agent, and nasal steroids to open up areas for better drainage along with avoidance of antihistamines.  We  will refill Augmentin 875 mg twice a day for 10 days.  Patient may return if symptoms repeat and further evaluation is necessary.

## 2022-04-01 ENCOUNTER — Other Ambulatory Visit: Payer: Self-pay | Admitting: Internal Medicine

## 2022-04-01 DIAGNOSIS — J4520 Mild intermittent asthma, uncomplicated: Secondary | ICD-10-CM

## 2022-04-01 NOTE — Telephone Encounter (Signed)
Requested medication (s) are due for refill today - yes  Requested medication (s) are on the active medication list -yes  Future visit scheduled -no  Last refill: 10/06/21 #60 1RF  Notes to clinic: non delegated Rx  Requested Prescriptions  Pending Prescriptions Disp Refills   MUCINEX D 60-600 MG 12 hr tablet [Pharmacy Med Name: Mucinex D '600mg'$ -'60mg'$  Extended-Release Tablet] 60 tablet 0    Sig: Take 1 tablet by mouth every 12 hours.     Not Delegated - Ear, Nose, and Throat:  Combination Products with Pseudoephedrine Failed - 04/01/2022  1:42 PM      Failed - This refill cannot be delegated      Passed - Last BP in normal range    BP Readings from Last 1 Encounters:  03/03/22 112/76         Passed - Valid encounter within last 12 months    Recent Outpatient Visits           4 weeks ago Acute maxillary sinusitis, recurrence not specified   [facility] Juline Patch, MD   7 months ago Annual physical exam   [facility] Glean Hess, MD   8 months ago Acute non-recurrent maxillary sinusitis   [facility] Glean Hess, MD   1 year ago Depression, major, single episode, in partial remission [facility])   [facility] Glean Hess, MD   2 years ago Environmental and seasonal allergies   [facility] Glean Hess, MD                 Requested Prescriptions  Pending Prescriptions Disp Refills   MUCINEX D 60-600 MG 12 hr tablet [Pharmacy Med Name: Mucinex D '600mg'$ -'60mg'$  Extended-Release Tablet] 60 tablet 0    Sig: Take 1 tablet by mouth every 12 hours.     Not Delegated - Ear, Nose, and Throat:  Combination Products with Pseudoephedrine Failed - 04/01/2022  1:42 PM      Failed - This refill cannot be delegated      Passed - Last BP in normal range    BP Readings from Last 1 Encounters:  03/03/22 112/76         Passed - Valid encounter within last 12 months    Recent Outpatient Visits           4 weeks  ago Acute maxillary sinusitis, recurrence not specified   [facility] Juline Patch, MD   7 months ago Annual physical exam   [facility] Glean Hess, MD   8 months ago Acute non-recurrent maxillary sinusitis   [facility] Glean Hess, MD   1 year ago Depression, major, single episode, in partial remission [facility])   [facility] Glean Hess, MD   2 years ago Environmental and seasonal allergies   [facility] Glean Hess, MD

## 2022-04-25 ENCOUNTER — Other Ambulatory Visit: Payer: Self-pay | Admitting: Internal Medicine

## 2022-04-25 DIAGNOSIS — F324 Major depressive disorder, single episode, in partial remission: Secondary | ICD-10-CM

## 2022-04-27 NOTE — Telephone Encounter (Signed)
Requested Prescriptions  Pending Prescriptions Disp Refills  . FLUoxetine (PROZAC) 20 MG tablet [Pharmacy Med Name: Fluoxetine Hydrochloride '20mg'$  Tablet] 90 tablet 0    Sig: Take 1 tablet by mouth once daily.     Psychiatry:  Antidepressants - SSRI Passed - 04/25/2022  2:48 PM      Passed - Completed PHQ-2 or PHQ-9 in the last 360 days      Passed - Valid encounter within last 6 months    Recent Outpatient Visits          1 month ago Acute maxillary sinusitis, recurrence not specified   [place] [facility] [place], [place], MD   7 months ago Annual physical exam   [place] [facility], Jesse Sans, MD   9 months ago Acute non-recurrent maxillary sinusitis   [place] [facility], Jesse Sans, MD   1 year ago Depression, major, single episode, in partial remission [facility])   [place] [facility] at [facility], Jesse Sans, MD   2 years ago Environmental and seasonal allergies    [facility] at [facility], Jesse Sans, MD

## 2022-04-28 ENCOUNTER — Other Ambulatory Visit: Payer: Self-pay | Admitting: Internal Medicine

## 2022-04-28 DIAGNOSIS — F324 Major depressive disorder, single episode, in partial remission: Secondary | ICD-10-CM

## 2022-04-29 NOTE — Telephone Encounter (Signed)
Requested medication (s) are due for refill today: No  Requested medication (s) are on the active medication list: Yes  Last refill:  04/27/22  Future visit scheduled: No  Notes to clinic:  Pharmacy has questions in regard to dosage.    Requested Prescriptions  Pending Prescriptions Disp Refills   FLUoxetine (PROZAC) 20 MG tablet [Pharmacy Med Name: Fluoxetine Hydrochloride '20mg'$  Tablet] 90 tablet 1    Sig: Take 1 tablet by mouth once daily.     Psychiatry:  Antidepressants - SSRI Passed - 04/28/2022 10:03 AM      Passed - Completed PHQ-2 or PHQ-9 in the last 360 days      Passed - Valid encounter within last 6 months    Recent Outpatient Visits           1 month ago Acute maxillary sinusitis, recurrence not specified   [place] [facility] [place] [place] [facility], Deanna C, MD   8 months ago Annual physical exam   [place] [facility], Jesse Sans, MD   9 months ago Acute non-recurrent maxillary sinusitis   [place] [facility] at [facility], Jesse Sans, MD   1 year ago Depression, major, single episode, in partial remission [facility])   [place] [facility] at [facility], Jesse Sans, MD   2 years ago Environmental and seasonal allergies   [place] [facility] at [facility], Jesse Sans, MD

## 2022-05-02 ENCOUNTER — Encounter: Payer: Self-pay | Admitting: Internal Medicine

## 2022-05-05 ENCOUNTER — Ambulatory Visit: Payer: Self-pay | Admitting: *Deleted

## 2022-05-05 NOTE — Telephone Encounter (Signed)
Called pharmacy let them know its 20 MG for fluoxetine. She verbalized understanding.  KP

## 2022-05-05 NOTE — Telephone Encounter (Signed)
Call received from [place], pharmacy tech from [employer] [phone] to clarify if fluoxetine 20 mg is to given or 40 mg as ordered by Sofie Hartigan, MD? Please advise. See patient message regarding increase in dose per psychiatrist. Please notify [facility] which dose to dispense.

## 2022-05-11 ENCOUNTER — Other Ambulatory Visit: Payer: Self-pay | Admitting: Internal Medicine

## 2022-08-06 ENCOUNTER — Other Ambulatory Visit: Payer: Self-pay | Admitting: Internal Medicine

## 2022-08-06 DIAGNOSIS — F324 Major depressive disorder, single episode, in partial remission: Secondary | ICD-10-CM

## 2022-08-09 NOTE — Telephone Encounter (Signed)
Unable to refill per protocol, Rx expired. Medication was discontinued 05/11/22 due to dose change. Will refuse.  Requested Prescriptions  Pending Prescriptions Disp Refills   FLUoxetine (PROZAC) 20 MG tablet [Pharmacy Med Name: Fluoxetine Hydrochloride '20mg'$  Tablet] 90 tablet 0    Sig: Take 1 tablet by mouth once daily. Replacing the 40 mg capsules.     Psychiatry:  Antidepressants - SSRI Passed - 08/06/2022 10:08 PM      Passed - Completed PHQ-2 or PHQ-9 in the last 360 days      Passed - Valid encounter within last 6 months    Recent Outpatient Visits           5 months ago Acute maxillary sinusitis, recurrence not specified   [place] [facility] [place], Deanna C, MD   11 months ago Annual physical exam   [place] [facility] at [facility], Jesse Sans, MD   1 year ago Acute non-recurrent maxillary sinusitis   [place] [facility] at [facility], Jesse Sans, MD   1 year ago Depression, major, single episode, in partial remission [facility])   [place] [facility] at [facility], Jesse Sans, MD   2 years ago Environmental and seasonal allergies   [place] [facility] at [facility], Jesse Sans, MD

## 2022-10-10 ENCOUNTER — Other Ambulatory Visit: Payer: Self-pay | Admitting: Internal Medicine

## 2022-10-10 DIAGNOSIS — F324 Major depressive disorder, single episode, in partial remission: Secondary | ICD-10-CM

## 2022-10-12 NOTE — Telephone Encounter (Signed)
Unable to refill per protocol, Rx expired. Discontinued to to dose change.  Requested Prescriptions  Pending Prescriptions Disp Refills   FLUoxetine (PROZAC) 20 MG tablet [Pharmacy Med Name: Fluoxetine Hydrochloride 58m Tablet] 90 tablet 0    Sig: Take 1 tablet by mouth once daily. Replacing the 40 mg capsules.     Psychiatry:  Antidepressants - SSRI Failed - 10/10/2022  4:45 PM      Failed - Valid encounter within last 6 months    Recent Outpatient Visits           7 months ago Acute maxillary sinusitis, recurrence not specified   [place] [facility] [place] [place] [place]   1 year ago Annual physical exam   [facility]   1 year ago Acute non-recurrent maxillary sinusitis   [place] [facility]   1 year ago Depression, major, single episode, in partial remission ([facility]   [facility]   2 years ago Environmental and seasonal allergies    [facility]              Passed - Completed PHQ-2 or PHQ-9 in the last 360 days

## 2022-10-29 ENCOUNTER — Ambulatory Visit (INDEPENDENT_AMBULATORY_CARE_PROVIDER_SITE_OTHER): Payer: Medicaid Other | Admitting: Internal Medicine

## 2022-10-29 ENCOUNTER — Encounter: Payer: Self-pay | Admitting: Internal Medicine

## 2022-10-29 VITALS — BP 104/78 | HR 92 | Ht 67.0 in | Wt 190.0 lb

## 2022-10-29 DIAGNOSIS — R509 Fever, unspecified: Secondary | ICD-10-CM | POA: Diagnosis not present

## 2022-10-29 DIAGNOSIS — J0100 Acute maxillary sinusitis, unspecified: Secondary | ICD-10-CM

## 2022-10-29 DIAGNOSIS — R0602 Shortness of breath: Secondary | ICD-10-CM

## 2022-10-29 DIAGNOSIS — R6889 Other general symptoms and signs: Secondary | ICD-10-CM

## 2022-10-29 DIAGNOSIS — R059 Cough, unspecified: Secondary | ICD-10-CM | POA: Diagnosis not present

## 2022-10-29 DIAGNOSIS — F324 Major depressive disorder, single episode, in partial remission: Secondary | ICD-10-CM

## 2022-10-29 LAB — POC COVID19 BINAXNOW: SARS Coronavirus 2 Ag: NEGATIVE

## 2022-10-29 LAB — POCT INFLUENZA A/B
Influenza A, POC: NEGATIVE
Influenza B, POC: NEGATIVE

## 2022-10-29 MED ORDER — FLUOXETINE HCL 20 MG PO CAPS: 20.0000 mg | ORAL_CAPSULE | Freq: Every day | ORAL | 1 refills | Status: DC

## 2022-10-29 MED ORDER — GUAIFENESIN-CODEINE 100-10 MG/5ML PO SYRP: 5.0000 mL | ORAL_SOLUTION | Freq: Three times a day (TID) | ORAL | 0 refills | Status: DC | PRN

## 2022-10-29 MED ORDER — AMOXICILLIN-POT CLAVULANATE 875-125 MG PO TABS: 1.0000 | ORAL_TABLET | Freq: Two times a day (BID) | ORAL | 0 refills | Status: AC

## 2022-10-29 NOTE — Progress Notes (Signed)
Date:  10/29/2022   Name:  Casey Marquez   DOB:  Jun 09, 1961   MRN:  [id number]   Chief Complaint: No chief complaint on file.  Cough This is a new problem. Episode onset: 5 days ago. The problem has been gradually worsening. The problem occurs every few minutes. The cough is Productive of sputum (white/clear/amber mucous). Associated symptoms include chills, a fever, nasal congestion, rhinorrhea, a sore throat and shortness of breath. Pertinent negatives include no wheezing.  Flu and Covid are negative.   Lab Results  Component Value Date   NA 144 09/01/2021   K 4.4 09/01/2021   CO2 25 09/01/2021   GLUCOSE 104 (H) 09/01/2021   BUN 22 09/01/2021   CREATININE 0.88 09/01/2021   CALCIUM 9.7 09/01/2021   EGFR 76 09/01/2021   GFRNONAA >60 04/11/2018   Lab Results  Component Value Date   CHOL 244 (H) 09/01/2021   HDL 49 09/01/2021   LDLCALC 175 (H) 09/01/2021   TRIG 113 09/01/2021   CHOLHDL 5.0 (H) 09/01/2021   Lab Results  Component Value Date   TSH 3.490 09/01/2021   Lab Results  Component Value Date   HGBA1C 5.6 09/01/2021   Lab Results  Component Value Date   WBC 8.5 09/01/2021   HGB 14.1 09/01/2021   HCT 41.6 09/01/2021   MCV 87 09/01/2021   PLT 352 09/01/2021   Lab Results  Component Value Date   ALT 6 09/01/2021   AST 20 09/01/2021   ALKPHOS 98 09/01/2021   BILITOT 0.3 09/01/2021   No results found for: "25OHVITD2", "25OHVITD3", "VD25OH"   Review of Systems  Constitutional:  Positive for chills and fever.  HENT:  Positive for rhinorrhea and sore throat. Negative for trouble swallowing.   Respiratory:  Positive for cough, chest tightness and shortness of breath. Negative for wheezing.   Gastrointestinal:  Negative for nausea and vomiting.  Psychiatric/Behavioral:  Positive for dysphoric mood. Negative for sleep disturbance. The patient is nervous/anxious.     Patient Active Problem List   Diagnosis Date Noted   Status post total abdominal  hysterectomy 11/25/2021   Mixed hyperlipidemia 09/02/2021   Anxiety disorder due to known physiological condition 03/21/2020   Mild intermittent asthma without complication [id number]   Gastroesophageal reflux disease 06/28/2017   Environmental and seasonal allergies 01/11/2015   Depression, major, single episode, in partial remission ([place]) 01/11/2015   Hot flash, menopausal 01/11/2015   Nerve pain 01/11/2015    Allergies  Allergen Reactions   Tegretol  [Carbamazepine]     confusion   Other Nausea Only    ALL OPIODS MAKE PT NAUSEATED   Sertraline Nausea Only   Tape Rash    Past Surgical History:  Procedure Laterality Date   EXCISIONAL HEMORRHOIDECTOMY  2002   fibroidectomy  1990   GANGLION CYST EXCISION  2007   INGUINAL HERNIA REPAIR Left 2015   LAPAROSCOPIC BILATERAL SALPINGO OOPHERECTOMY Bilateral 04/17/2018   Procedure: LAPAROSCOPIC BILATERAL SALPINGO OOPHORECTOMY;  Surgeon: Ward, Honor Loh, MD;  Location: [facility];  Service: Gynecology;  Laterality: Bilateral;   LAPAROSCOPIC HYSTERECTOMY N/A 04/17/2018   Procedure: HYSTERECTOMY TOTAL LAPAROSCOPIC;  Surgeon: Ward, Honor Loh, MD;  Location: [facility];  Service: Gynecology;  Laterality: N/A;   TENDON REPAIR Right    TOTAL ABDOMINAL HYSTERECTOMY      Social History   Tobacco Use   Smoking status: Never   Smokeless tobacco: Never  Vaping Use   Vaping Use: Never used  Substance Use Topics  Alcohol use: Yes    Alcohol/week: 0.0 standard drinks of alcohol    Comment: EVERY OTHER DAY   Drug use: No     Medication list has been reviewed and updated.  Current Meds  Medication Sig   Amino Acids (TRIAMINO) TABS Take 2 tablets by mouth every evening.   amoxicillin-clavulanate (AUGMENTIN) 875-125 MG tablet Take 1 tablet by mouth 2 (two) times daily for 10 days.   Budesonide (PULMICORT FLEXHALER) 90 MCG/ACT inhaler Inhale 1 puff by mouth into the lungs twice daily.   cetirizine (ZYRTEC) 10 MG tablet Take 10 mg by mouth  daily as needed for allergies.   Cholecalciferol (VITAMIN D3) 10000 units capsule Take 10,000 Units by mouth every evening.   dicyclomine (BENTYL) 20 MG tablet Take 1 tablet (20 mg total) by mouth 3 (three) times daily before meals. PRN   FLUoxetine (PROZAC) 20 MG capsule Take 1 capsule (20 mg total) by mouth daily.   fluticasone (FLONASE) 50 MCG/ACT nasal spray Place 2 sprays into both nostrils daily as needed for allergies.    guaiFENesin-codeine (ROBITUSSIN AC) 100-10 MG/5ML syrup Take 5 mLs by mouth 3 (three) times daily as needed for cough.   Hydrocortisone Acetate 1 % OINT Apply 1 application topically 4 (four) times daily as needed.   LORazepam (ATIVAN) 0.5 MG tablet Take 0.5 tablets (0.25 mg total) by mouth daily as needed for anxiety.   Magnesium 400 MG TABS Take 400 mg by mouth every evening.   Melatonin 5 MG TABS Take 5 mg by mouth at bedtime.   MUCINEX D 60-600 MG 12 hr tablet Take 1 tablet by mouth every 12 hours.   Multiple Vitamins-Minerals (AIRBORNE) CHEW Chew 1 tablet by mouth daily as needed (immune support).   OIL OF OREGANO PO Take 1 capsule by mouth daily as needed (immune support).   omeprazole (PRILOSEC) 20 MG capsule Take 1 capsule (20 mg total) by mouth daily. (Patient taking differently: Take 20 mg by mouth every evening.)   OVER THE COUNTER MEDICATION Take 1 mL by mouth daily as needed (mood / memory). Deprenyl otc supplement   progesterone (PROMETRIUM) 200 MG capsule Take 1 capsule by mouth at bedtime.   tiZANidine (ZANAFLEX) 4 MG tablet Take 1 tablet by mouth every 8 hours as needed for muscle spasms.   [DISCONTINUED] levalbuterol (XOPENEX HFA) 45 MCG/ACT inhaler Inhale 2 puffs into the lungs every 6 (six) hours as needed for wheezing.       10/29/2022    4:57 PM 03/03/2022    1:15 PM 09/01/2021   11:00 AM 07/07/2021    3:38 PM  GAD 7 : Generalized Anxiety Score  Nervous, Anxious, on Edge 2 0 0 1  Control/stop worrying 1 0 0 0  Worry too much - different things 0  0 0 0  Trouble relaxing 0 0 0 1  Restless 0 0 0 0  Easily annoyed or irritable 1 1 0 1  Afraid - awful might happen 0 0 0 0  Total GAD 7 Score 4 1 0 3  Anxiety Difficulty Not difficult at all Not difficult at all Not difficult at all Not difficult at all       10/29/2022    4:57 PM 03/03/2022    1:10 PM 09/01/2021   11:00 AM  Depression screen PHQ 2/9  Decreased Interest 3 1 0  Down, Depressed, Hopeless 2 1 0  PHQ - 2 Score 5 2 0  Altered sleeping 2 1 0  Tired, decreased  energy '3 1 2  '$ Change in appetite 3 0 0  Feeling bad or failure about yourself  1  0  Trouble concentrating 1 0 0  Moving slowly or fidgety/restless 1 0 0  Suicidal thoughts 0 0 0  PHQ-9 Score '16 4 2  '$ Difficult doing work/chores Not difficult at all Not difficult at all Not difficult at all    BP Readings from Last 3 Encounters:  10/29/22 104/78  03/03/22 112/76  02/15/22 119/83    Physical Exam Vitals and nursing note reviewed.  Constitutional:      General: She is not in acute distress.    Appearance: Normal appearance. She is well-developed.  HENT:     Head: Normocephalic and atraumatic.  Cardiovascular:     Rate and Rhythm: Normal rate and regular rhythm.  Pulmonary:     Effort: Pulmonary effort is normal. No respiratory distress.     Breath sounds: No wheezing or rhonchi.     Comments: Frequent loose cough with yellow sputum Musculoskeletal:     Cervical back: Normal range of motion.     Right lower leg: No edema.     Left lower leg: No edema.  Skin:    General: Skin is warm and dry.     Findings: No rash.  Neurological:     Mental Status: She is alert and oriented to person, place, and time.  Psychiatric:        Mood and Affect: Mood normal.        Behavior: Behavior normal.     Wt Readings from Last 3 Encounters:  10/29/22 190 lb (86.2 kg)  03/03/22 191 lb (86.6 kg)  09/01/21 196 lb (88.9 kg)    BP 104/78   Pulse 92   Ht '5\' 7"'$  (1.702 m)   Wt 190 lb (86.2 kg)   LMP 04/10/2008    SpO2 95%   BMI 29.76 kg/m   Assessment and Plan: Problem List Items Addressed This Visit       Other   Depression, major, single episode, in partial remission ([place])    Seen briefly by psych - prozac increased to 40 mg but she reverted to 20 mg She does not want to return to Psych and wants a refill      Relevant Medications   FLUoxetine (PROZAC) 20 MG capsule   Other Visit Diagnoses     Acute non-recurrent maxillary sinusitis    -  Primary   with bronchitis Use steroid inhaler daily Mucinex-DM   Relevant Medications   amoxicillin-clavulanate (AUGMENTIN) 875-125 MG tablet   guaiFENesin-codeine (ROBITUSSIN AC) 100-10 MG/5ML syrup   Flu-like symptoms       Relevant Orders   POCT Influenza A/B (Completed)   POC COVID-19 (Completed)        Partially dictated using Editor, commissioning. Any errors are unintentional.  Halina Maidens, MD [facility]  10/29/2022

## 2022-10-29 NOTE — Assessment & Plan Note (Signed)
Seen briefly by psych - prozac increased to 40 mg but she reverted to 20 mg She does not want to return to Psych and wants a refill

## 2022-12-29 ENCOUNTER — Ambulatory Visit (INDEPENDENT_AMBULATORY_CARE_PROVIDER_SITE_OTHER): Payer: Medicaid Other | Admitting: Internal Medicine

## 2022-12-29 ENCOUNTER — Encounter: Payer: Self-pay | Admitting: Internal Medicine

## 2022-12-29 DIAGNOSIS — F324 Major depressive disorder, single episode, in partial remission: Secondary | ICD-10-CM | POA: Diagnosis not present

## 2022-12-29 MED ORDER — FLUOXETINE HCL 40 MG PO CAPS: 40.0000 mg | ORAL_CAPSULE | Freq: Every day | ORAL | 0 refills | Status: DC

## 2022-12-29 MED ORDER — ARIPIPRAZOLE 5 MG PO TABS: 5.0000 mg | ORAL_TABLET | Freq: Every day | ORAL | 0 refills | Status: DC

## 2022-12-29 NOTE — Assessment & Plan Note (Addendum)
Clinically worsening on current regimen with poor control of symptoms, No SI or HI. Good social support at home. She increased Prozac to 40 mg daily for the past week without benefit Will continue prozac, therapy weekly Add Abilify 5 mg daily

## 2022-12-29 NOTE — Progress Notes (Signed)
Date:  12/29/2022   Name:  Casey Marquez   DOB:  October 02, 1960   MRN:  [id number]   Chief Complaint: Depression  Depression        This is a chronic problem.  The problem occurs constantly.The problem is unchanged.  Associated symptoms include decreased concentration, fatigue, helplessness, hopelessness, irritable, restlessness and decreased interest.  Associated symptoms include no headaches and no suicidal ideas.  Past treatments include SSRIs - Selective serotonin reuptake inhibitors (prozac 40 mg).   Lab Results  Component Value Date   NA 144 09/01/2021   K 4.4 09/01/2021   CO2 25 09/01/2021   GLUCOSE 104 (H) 09/01/2021   BUN 22 09/01/2021   CREATININE 0.88 09/01/2021   CALCIUM 9.7 09/01/2021   EGFR 76 09/01/2021   GFRNONAA >60 04/11/2018   Lab Results  Component Value Date   CHOL 244 (H) 09/01/2021   HDL 49 09/01/2021   LDLCALC 175 (H) 09/01/2021   TRIG 113 09/01/2021   CHOLHDL 5.0 (H) 09/01/2021   Lab Results  Component Value Date   TSH 3.490 09/01/2021   Lab Results  Component Value Date   HGBA1C 5.6 09/01/2021   Lab Results  Component Value Date   WBC 8.5 09/01/2021   HGB 14.1 09/01/2021   HCT 41.6 09/01/2021   MCV 87 09/01/2021   PLT 352 09/01/2021   Lab Results  Component Value Date   ALT 6 09/01/2021   AST 20 09/01/2021   ALKPHOS 98 09/01/2021   BILITOT 0.3 09/01/2021   No results found for: "25OHVITD2", "25OHVITD3", "VD25OH"   Review of Systems  Constitutional:  Positive for fatigue. Negative for chills and fever.  HENT:  Negative for trouble swallowing.   Respiratory:  Negative for chest tightness and shortness of breath.   Cardiovascular:  Negative for chest pain.  Gastrointestinal:  Positive for diarrhea. Negative for abdominal pain and constipation.  Neurological:  Negative for dizziness and headaches.  Psychiatric/Behavioral:  Positive for decreased concentration, depression, dysphoric mood and sleep disturbance. Negative for suicidal  ideas. The patient is nervous/anxious.     Patient Active Problem List   Diagnosis Date Noted   Status post total abdominal hysterectomy 11/25/2021   Mixed hyperlipidemia 09/02/2021   Anxiety disorder due to known physiological condition 03/21/2020   Mild intermittent asthma without complication 03/21/2020   Gastroesophageal reflux disease 06/28/2017   Environmental and seasonal allergies 01/11/2015   Depression, major, single episode, in partial remission ([facility]) 01/11/2015   Hot flash, menopausal 01/11/2015   Nerve pain 01/11/2015    Allergies  Allergen Reactions   Tegretol  [Carbamazepine]     confusion   Other Nausea Only    ALL OPIODS MAKE PT NAUSEATED   Sertraline Nausea Only   Tape Rash    Past Surgical History:  Procedure Laterality Date   EXCISIONAL HEMORRHOIDECTOMY  2002   fibroidectomy  1990   GANGLION CYST EXCISION  2007   INGUINAL HERNIA REPAIR Left 2015   LAPAROSCOPIC BILATERAL SALPINGO OOPHERECTOMY Bilateral 04/17/2018   Procedure: LAPAROSCOPIC BILATERAL SALPINGO OOPHORECTOMY;  Surgeon: Ward, Elenora Fender, MD;  Location: [facility];  Service: Gynecology;  Laterality: Bilateral;   LAPAROSCOPIC HYSTERECTOMY N/A 04/17/2018   Procedure: HYSTERECTOMY TOTAL LAPAROSCOPIC;  Surgeon: Ward, Elenora Fender, MD;  Location: [facility];  Service: Gynecology;  Laterality: N/A;   TENDON REPAIR Right    TOTAL ABDOMINAL HYSTERECTOMY      Social History   Tobacco Use   Smoking status: Never   Smokeless tobacco: Never  Vaping Use   Vaping Use: Never used  Substance Use Topics   Alcohol use: Yes    Alcohol/week: 0.0 standard drinks of alcohol    Comment: EVERY OTHER DAY   Drug use: No     Medication list has been reviewed and updated.  Current Meds  Medication Sig   Amino Acids (TRIAMINO) TABS Take 2 tablets by mouth every evening.   ARIPiprazole (ABILIFY) 5 MG tablet Take 1 tablet (5 mg total) by mouth daily.   Budesonide (PULMICORT FLEXHALER) 90 MCG/ACT inhaler Inhale 1  puff by mouth into the lungs twice daily.   cetirizine (ZYRTEC) 10 MG tablet Take 10 mg by mouth daily as needed for allergies.   Cholecalciferol (VITAMIN D3) 10000 units capsule Take 10,000 Units by mouth every evening.   Dextrose-Fructose-Sod Citrate [id number] MG CHEW Chew 1-2 tablets by mouth daily as needed (nausea).   dicyclomine (BENTYL) 20 MG tablet Take 1 tablet (20 mg total) by mouth 3 (three) times daily before meals. PRN   fluticasone (FLONASE) 50 MCG/ACT nasal spray Place 2 sprays into both nostrils daily as needed for allergies.    Hydrocortisone Acetate 1 % OINT Apply 1 application topically 4 (four) times daily as needed.   LORazepam (ATIVAN) 0.5 MG tablet Take 0.5 tablets (0.25 mg total) by mouth daily as needed for anxiety.   Magnesium 400 MG TABS Take 400 mg by mouth every evening.   Melatonin 5 MG TABS Take 5 mg by mouth at bedtime.   MUCINEX D 60-600 MG 12 hr tablet Take 1 tablet by mouth every 12 hours.   Multiple Vitamins-Minerals (AIRBORNE) CHEW Chew 1 tablet by mouth daily as needed (immune support).   OIL OF OREGANO PO Take 1 capsule by mouth daily as needed (immune support).   omeprazole (PRILOSEC) 20 MG capsule Take 1 capsule (20 mg total) by mouth daily. (Patient taking differently: Take 20 mg by mouth every evening.)   OVER THE COUNTER MEDICATION Take 1 mL by mouth daily as needed (mood / memory). Deprenyl otc supplement   progesterone (PROMETRIUM) 200 MG capsule Take 1 capsule by mouth at bedtime.   tiZANidine (ZANAFLEX) 4 MG tablet Take 1 tablet by mouth every 8 hours as needed for muscle spasms.   [DISCONTINUED] FLUoxetine (PROZAC) 20 MG capsule Take 1 capsule (20 mg total) by mouth daily.       12/29/2022    3:33 PM 10/29/2022    4:57 PM 03/03/2022    1:15 PM 09/01/2021   11:00 AM  GAD 7 : Generalized Anxiety Score  Nervous, Anxious, on Edge 2 2 0 0  Control/stop worrying 2 1 0 0  Worry too much - different things 2 0 0 0  Trouble relaxing 2 0 0 0  Restless  2 0 0 0  Easily annoyed or irritable 3 1 1  0  Afraid - awful might happen 1 0 0 0  Total GAD 7 Score 14 4 1  0  Anxiety Difficulty Somewhat difficult Not difficult at all Not difficult at all Not difficult at all       12/29/2022    3:33 PM 10/29/2022    4:57 PM 03/03/2022    1:10 PM  Depression screen PHQ 2/9  Decreased Interest 3 3 1   Down, Depressed, Hopeless 3 2 1   PHQ - 2 Score 6 5 2   Altered sleeping 3 2 1   Tired, decreased energy 3 3 1   Change in appetite 3 3 0  Feeling bad or failure about  yourself  1 1   Trouble concentrating 2 1 0  Moving slowly or fidgety/restless 2 1 0  Suicidal thoughts 0 0 0  PHQ-9 Score 20 16 4   Difficult doing work/chores Very difficult Not difficult at all Not difficult at all    BP Readings from Last 3 Encounters:  12/29/22 120/70  10/29/22 104/78  03/03/22 112/76    Physical Exam Vitals and nursing note reviewed.  Constitutional:      General: She is irritable. She is not in acute distress.    Appearance: She is well-developed.  HENT:     Head: Normocephalic and atraumatic.  Pulmonary:     Effort: Pulmonary effort is normal. No respiratory distress.  Skin:    General: Skin is warm and dry.     Findings: No rash.  Neurological:     Mental Status: She is alert and oriented to person, place, and time.  Psychiatric:        Mood and Affect: Mood is depressed. Affect is tearful.        Speech: Speech normal.        Behavior: Behavior normal.        Cognition and Memory: Cognition normal.        Judgment: Judgment normal.     Wt Readings from Last 3 Encounters:  12/29/22 189 lb 9.6 oz (86 kg)  10/29/22 190 lb (86.2 kg)  03/03/22 191 lb (86.6 kg)    BP 120/70   Pulse 87   Ht 5\' 7"  (1.702 m)   Wt 189 lb 9.6 oz (86 kg)   LMP 04/10/2008   SpO2 96%   BMI 29.70 kg/m   Assessment and Plan:  Problem List Items Addressed This Visit       Other   Depression, major, single episode, in partial remission ([facility])    Clinically  worsening on current regimen with poor control of symptoms, No SI or HI. Good social support at home. She increased Prozac to 40 mg daily for the past week without benefit Will continue prozac, therapy weekly Add Abilify 5 mg daily        Relevant Medications   ARIPiprazole (ABILIFY) 5 MG tablet   FLUoxetine (PROZAC) 40 MG capsule    Return in about 3 weeks (around 01/19/2023) for Depression.   Partially dictated using Dragon software, any errors are not intentional.  Reubin Milan, MD [facility] [place], [place]

## 2022-12-30 ENCOUNTER — Other Ambulatory Visit: Payer: Self-pay | Admitting: Internal Medicine

## 2022-12-30 DIAGNOSIS — F324 Major depressive disorder, single episode, in partial remission: Secondary | ICD-10-CM

## 2023-01-07 ENCOUNTER — Other Ambulatory Visit: Payer: Self-pay | Admitting: Internal Medicine

## 2023-01-19 ENCOUNTER — Encounter: Payer: Self-pay | Admitting: Internal Medicine

## 2023-01-19 ENCOUNTER — Ambulatory Visit (INDEPENDENT_AMBULATORY_CARE_PROVIDER_SITE_OTHER): Payer: Medicaid Other | Admitting: Internal Medicine

## 2023-01-19 VITALS — BP 110/62 | HR 87 | Ht 67.0 in | Wt 190.0 lb

## 2023-01-19 DIAGNOSIS — F324 Major depressive disorder, single episode, in partial remission: Secondary | ICD-10-CM | POA: Diagnosis not present

## 2023-01-19 DIAGNOSIS — H5789 Other specified disorders of eye and adnexa: Secondary | ICD-10-CM

## 2023-01-19 MED ORDER — ERYTHROMYCIN 5 MG/GM OP OINT: 1.0000 | TOPICAL_OINTMENT | Freq: Every day | OPHTHALMIC | 0 refills | Status: DC

## 2023-01-19 NOTE — Assessment & Plan Note (Addendum)
Depressive symptoms unchanged since adding Abilify- her only benefit has been improved interest in activities and feeling less hopeless. However having some sleep disruption and difficulty concentrating. Stop Abilify. Will continue Prozac 40 mg qod and add Vraylar 1.5 mg. Follow up 3 weeks

## 2023-01-19 NOTE — Patient Instructions (Signed)
Stop Abilify.  Start Vraylar 1.5 mg daily.

## 2023-01-19 NOTE — Assessment & Plan Note (Deleted)
Follow up since adding Abilify to Prozac 40 mg.

## 2023-01-19 NOTE — Progress Notes (Signed)
Date:  01/19/2023   Name:  Casey Marquez   DOB:  [birth year]   MRN:  [id number]   Chief Complaint: Depression  Depression        This is a chronic problem.  The problem occurs daily.  The problem has been gradually improving since onset.  Associated symptoms include no fatigue and no headaches.  Past treatments include SSRIs - Selective serotonin reuptake inhibitors (on Prozac; Abilify added 6 weeks ago).  Compliance with treatment is good.   Lab Results  Component Value Date   NA 144 09/01/2021   K 4.4 09/01/2021   CO2 25 09/01/2021   GLUCOSE 104 (H) 09/01/2021   BUN 22 09/01/2021   CREATININE 0.88 09/01/2021   CALCIUM 9.7 09/01/2021   EGFR 76 09/01/2021   GFRNONAA >60 04/11/2018   Lab Results  Component Value Date   CHOL 244 (H) 09/01/2021   HDL 49 09/01/2021   LDLCALC 175 (H) 09/01/2021   TRIG 113 09/01/2021   CHOLHDL 5.0 (H) 09/01/2021   Lab Results  Component Value Date   TSH 3.490 09/01/2021   Lab Results  Component Value Date   HGBA1C 5.6 09/01/2021   Lab Results  Component Value Date   WBC 8.5 09/01/2021   HGB 14.1 09/01/2021   HCT 41.6 09/01/2021   MCV 87 09/01/2021   PLT 352 09/01/2021   Lab Results  Component Value Date   ALT 6 09/01/2021   AST 20 09/01/2021   ALKPHOS 98 09/01/2021   BILITOT 0.3 09/01/2021   No results found for: "25OHVITD2", "25OHVITD3", "VD25OH"   Review of Systems  Constitutional:  Negative for fatigue and unexpected weight change.  HENT:  Negative for nosebleeds.   Eyes:  Negative for visual disturbance.  Respiratory:  Negative for cough, chest tightness, shortness of breath and wheezing.   Cardiovascular:  Negative for chest pain, palpitations and leg swelling.  Gastrointestinal:  Negative for abdominal pain, constipation and diarrhea.  Neurological:  Negative for dizziness, weakness, light-headedness and headaches.  Psychiatric/Behavioral:  Positive for depression.     Patient Active Problem List   Diagnosis Date  Noted   Status post total abdominal hysterectomy 11/25/2021   Mixed hyperlipidemia 09/02/2021   Anxiety disorder due to known physiological condition 03/21/2020   Mild intermittent asthma without complication 03/21/2020   Gastroesophageal reflux disease 06/28/2017   Environmental and seasonal allergies 01/11/2015   Depression, major, single episode, in partial remission (HCC) 01/11/2015   Hot flash, menopausal 01/11/2015   Nerve pain 01/11/2015    Allergies  Allergen Reactions   Tegretol  [Carbamazepine]     confusion   Other Nausea Only    ALL OPIODS MAKE PT NAUSEATED   Sertraline Nausea Only   Tape Rash    Past Surgical History:  Procedure Laterality Date   EXCISIONAL HEMORRHOIDECTOMY  2002   fibroidectomy  1990   GANGLION CYST EXCISION  2007   INGUINAL HERNIA REPAIR Left 2015   LAPAROSCOPIC BILATERAL SALPINGO OOPHERECTOMY Bilateral 04/17/2018   Procedure: LAPAROSCOPIC BILATERAL SALPINGO OOPHORECTOMY;  Surgeon: Ward, Elenora Fender, MD;  Location: [facility];  Service: Gynecology;  Laterality: Bilateral;   LAPAROSCOPIC HYSTERECTOMY N/A 04/17/2018   Procedure: HYSTERECTOMY TOTAL LAPAROSCOPIC;  Surgeon: Ward, Elenora Fender, MD;  Location: [facility];  Service: Gynecology;  Laterality: N/A;   TENDON REPAIR Right    TOTAL ABDOMINAL HYSTERECTOMY      Social History   Tobacco Use   Smoking status: Never   Smokeless tobacco: Never  Vaping Use  Vaping Use: Never used  Substance Use Topics   Alcohol use: Yes    Alcohol/week: 0.0 standard drinks of alcohol    Comment: EVERY OTHER DAY   Drug use: No     Medication list has been reviewed and updated.  Current Meds  Medication Sig   Amino Acids (TRIAMINO) TABS Take 2 tablets by mouth every evening.   ARIPiprazole (ABILIFY) 5 MG tablet Take 1 tablet (5 mg total) by mouth daily.   Budesonide (PULMICORT FLEXHALER) 90 MCG/ACT inhaler Inhale 1 puff by mouth into the lungs twice daily.   cetirizine (ZYRTEC) 10 MG tablet Take 10 mg by  mouth daily as needed for allergies.   Cholecalciferol (VITAMIN D3) 10000 units capsule Take 10,000 Units by mouth every evening.   Dextrose-Fructose-Sod Citrate [phone] MG CHEW Chew 1-2 tablets by mouth daily as needed (nausea).   dicyclomine (BENTYL) 20 MG tablet Take 1 tablet (20 mg total) by mouth 3 (three) times daily before meals. PRN   erythromycin ophthalmic ointment Place 1 Application into both eyes at bedtime.   FLUoxetine (PROZAC) 40 MG capsule Take 1 capsule by mouth daily. (Patient taking differently: Take 20 mg by mouth daily.)   fluticasone (FLONASE) 50 MCG/ACT nasal spray Place 2 sprays into both nostrils daily as needed for allergies.    Hydrocortisone Acetate 1 % OINT Apply 1 application topically 4 (four) times daily as needed.   LORazepam (ATIVAN) 0.5 MG tablet Take 0.5 tablets (0.25 mg total) by mouth daily as needed for anxiety.   Magnesium 400 MG TABS Take 400 mg by mouth every evening.   Melatonin 5 MG TABS Take 5 mg by mouth at bedtime.   MUCINEX D 60-600 MG 12 hr tablet Take 1 tablet by mouth every 12 hours.   Multiple Vitamins-Minerals (AIRBORNE) CHEW Chew 1 tablet by mouth daily as needed (immune support).   OIL OF OREGANO PO Take 1 capsule by mouth daily as needed (immune support).   omeprazole (PRILOSEC) 20 MG capsule Take 1 capsule (20 mg total) by mouth daily. (Patient taking differently: Take 20 mg by mouth every evening.)   OVER THE COUNTER MEDICATION Take 1 mL by mouth daily as needed (mood / memory). Deprenyl otc supplement   progesterone (PROMETRIUM) 200 MG capsule Take 1 capsule by mouth at bedtime.   tiZANidine (ZANAFLEX) 4 MG tablet Take 1 tablet by mouth every 8 hours as needed for muscle spasms.       01/19/2023    3:10 PM 12/29/2022    3:33 PM 10/29/2022    4:57 PM 03/03/2022    1:15 PM  GAD 7 : Generalized Anxiety Score  Nervous, Anxious, on Edge 2 2 2  0  Control/stop worrying 2 2 1  0  Worry too much - different things 2 2 0 0  Trouble relaxing  2 2 0 0  Restless 2 2 0 0  Easily annoyed or irritable 3 3 1 1   Afraid - awful might happen 1 1 0 0  Total GAD 7 Score 14 14 4 1   Anxiety Difficulty Somewhat difficult Somewhat difficult Not difficult at all Not difficult at all       01/19/2023    3:08 PM 12/29/2022    3:33 PM 10/29/2022    4:57 PM  Depression screen PHQ 2/9  Decreased Interest 2 3 3   Down, Depressed, Hopeless 1 3 2   PHQ - 2 Score 3 6 5   Altered sleeping 3 3 2   Tired, decreased energy 3 3 3  Change in appetite 2 3 3   Feeling bad or failure about yourself  1 1 1   Trouble concentrating 3 2 1   Moving slowly or fidgety/restless 3 2 1   Suicidal thoughts 0 0 0  PHQ-9 Score 18 20 16   Difficult doing work/chores Somewhat difficult Very difficult Not difficult at all    BP Readings from Last 3 Encounters:  01/19/23 110/62  12/29/22 120/70  10/29/22 104/78    Physical Exam Vitals and nursing note reviewed.  Constitutional:      General: She is not in acute distress.    Appearance: She is well-developed.  HENT:     Head: Normocephalic and atraumatic.  Pulmonary:     Effort: Pulmonary effort is normal. No respiratory distress.  Skin:    General: Skin is warm and dry.     Findings: No rash.  Neurological:     Mental Status: She is alert and oriented to person, place, and time.  Psychiatric:        Attention and Perception: Attention normal.        Mood and Affect: Mood and affect normal.        Speech: Speech normal.        Behavior: Behavior normal.     Wt Readings from Last 3 Encounters:  01/19/23 190 lb (86.2 kg)  12/29/22 189 lb 9.6 oz (86 kg)  10/29/22 190 lb (86.2 kg)    BP 110/62   Pulse 87   Ht 5\' 7"  (1.702 m)   Wt 190 lb (86.2 kg)   LMP 04/10/2008   SpO2 97%   BMI 29.76 kg/m   Assessment and Plan:  Problem List Items Addressed This Visit     Depression, major, single episode, in partial remission (HCC) - Primary    Depressive symptoms unchanged since adding Abilify- her only benefit  has been improved interest in activities and feeling less hopeless. However having some sleep disruption and difficulty concentrating. Stop Abilify. Will continue Prozac 40 mg qod and add Vraylar 1.5 mg. Follow up 3 weeks      Other Visit Diagnoses     Eye inflamed       Relevant Medications   erythromycin ophthalmic ointment       Return in about 3 weeks (around 02/09/2023) for Depression.   Partially dictated using Dragon software, any errors are not intentional.  Reubin Milan, MD [facility] [place], [place]

## 2023-01-24 ENCOUNTER — Other Ambulatory Visit: Payer: Self-pay | Admitting: Internal Medicine

## 2023-01-24 DIAGNOSIS — J4520 Mild intermittent asthma, uncomplicated: Secondary | ICD-10-CM

## 2023-01-24 DIAGNOSIS — F324 Major depressive disorder, single episode, in partial remission: Secondary | ICD-10-CM

## 2023-02-04 ENCOUNTER — Encounter: Payer: Self-pay | Admitting: Internal Medicine

## 2023-02-09 ENCOUNTER — Ambulatory Visit: Payer: Medicaid Other | Admitting: Internal Medicine

## 2023-02-28 ENCOUNTER — Telehealth: Payer: Self-pay

## 2023-02-28 ENCOUNTER — Encounter: Payer: Self-pay | Admitting: Internal Medicine

## 2023-02-28 ENCOUNTER — Ambulatory Visit (INDEPENDENT_AMBULATORY_CARE_PROVIDER_SITE_OTHER): Payer: Medicaid Other | Admitting: Internal Medicine

## 2023-02-28 VITALS — BP 108/64 | HR 71 | Ht 67.0 in | Wt 194.0 lb

## 2023-02-28 DIAGNOSIS — Z1231 Encounter for screening mammogram for malignant neoplasm of breast: Secondary | ICD-10-CM | POA: Diagnosis not present

## 2023-02-28 DIAGNOSIS — F324 Major depressive disorder, single episode, in partial remission: Secondary | ICD-10-CM

## 2023-02-28 MED ORDER — CARIPRAZINE HCL 1.5 MG PO CAPS: 1.5000 mg | ORAL_CAPSULE | Freq: Every day | ORAL | 0 refills | Status: DC

## 2023-02-28 MED ORDER — FLUOXETINE HCL 20 MG PO CAPS
20.0000 mg | ORAL_CAPSULE | Freq: Every day | ORAL | 1 refills | Status: DC
Start: 2023-02-28 — End: 2024-04-13

## 2023-02-28 NOTE — Patient Instructions (Signed)
Call [facility] to schedule your mammogram at [phone].  

## 2023-02-28 NOTE — Progress Notes (Signed)
Date:  02/28/2023   Name:  Casey Marquez   DOB:  28-Feb-1961   MRN:  [id number]   Chief Complaint: Depression  Depression        This is a chronic problem.  The problem has been gradually improving since onset.  Associated symptoms include no fatigue and no headaches.  Past treatments include SSRIs - Selective serotonin reuptake inhibitors (but failed Abilify - now on Vraylar).  Compliance with treatment is good.  Previous treatment provided significant relief.   Lab Results  Component Value Date   NA 144 09/01/2021   K 4.4 09/01/2021   CO2 25 09/01/2021   GLUCOSE 104 (H) 09/01/2021   BUN 22 09/01/2021   CREATININE 0.88 09/01/2021   CALCIUM 9.7 09/01/2021   EGFR 76 09/01/2021   GFRNONAA >60 04/11/2018   Lab Results  Component Value Date   CHOL 244 (H) 09/01/2021   HDL 49 09/01/2021   LDLCALC 175 (H) 09/01/2021   TRIG 113 09/01/2021   CHOLHDL 5.0 (H) 09/01/2021   Lab Results  Component Value Date   TSH 3.490 09/01/2021   Lab Results  Component Value Date   HGBA1C 5.6 09/01/2021   Lab Results  Component Value Date   WBC 8.5 09/01/2021   HGB 14.1 09/01/2021   HCT 41.6 09/01/2021   MCV 87 09/01/2021   PLT 352 09/01/2021   Lab Results  Component Value Date   ALT 6 09/01/2021   AST 20 09/01/2021   ALKPHOS 98 09/01/2021   BILITOT 0.3 09/01/2021   No results found for: "25OHVITD2", "25OHVITD3", "VD25OH"   Review of Systems  Constitutional:  Negative for fatigue and unexpected weight change.  HENT:  Negative for nosebleeds.   Eyes:  Negative for visual disturbance.  Respiratory:  Negative for cough, chest tightness, shortness of breath and wheezing.   Cardiovascular:  Negative for chest pain, palpitations and leg swelling.  Gastrointestinal:  Negative for abdominal pain, constipation and diarrhea.  Neurological:  Negative for dizziness, weakness, light-headedness and headaches.  Psychiatric/Behavioral:  Positive for depression. Negative for dysphoric mood and  sleep disturbance. The patient is not nervous/anxious.     Patient Active Problem List   Diagnosis Date Noted   Status post total abdominal hysterectomy 11/25/2021   Mixed hyperlipidemia 09/02/2021   Anxiety disorder due to known physiological condition 03/21/2020   Mild intermittent asthma without complication 03/21/2020   Gastroesophageal reflux disease 06/28/2017   Environmental and seasonal allergies 01/11/2015   Depression, major, single episode, in partial remission (HCC) 01/11/2015   Hot flash, menopausal 01/11/2015   Nerve pain 01/11/2015    Allergies  Allergen Reactions   Tegretol  [Carbamazepine]     confusion   Other Nausea Only    ALL OPIODS MAKE PT NAUSEATED   Sertraline Nausea Only   Tape Rash    Past Surgical History:  Procedure Laterality Date   EXCISIONAL HEMORRHOIDECTOMY  2002   fibroidectomy  1990   GANGLION CYST EXCISION  2007   INGUINAL HERNIA REPAIR Left 2015   LAPAROSCOPIC BILATERAL SALPINGO OOPHERECTOMY Bilateral 04/17/2018   Procedure: LAPAROSCOPIC BILATERAL SALPINGO OOPHORECTOMY;  Surgeon: Ward, Elenora Fender, MD;  Location: [facility];  Service: Gynecology;  Laterality: Bilateral;   LAPAROSCOPIC HYSTERECTOMY N/A 04/17/2018   Procedure: HYSTERECTOMY TOTAL LAPAROSCOPIC;  Surgeon: Ward, Elenora Fender, MD;  Location: [facility];  Service: Gynecology;  Laterality: N/A;   TENDON REPAIR Right    TOTAL ABDOMINAL HYSTERECTOMY      Social History   Tobacco Use  Smoking status: Never   Smokeless tobacco: Never  Vaping Use   Vaping Use: Never used  Substance Use Topics   Alcohol use: Yes    Alcohol/week: 0.0 standard drinks of alcohol    Comment: EVERY OTHER DAY   Drug use: No     Medication list has been reviewed and updated.  Current Meds  Medication Sig   Amino Acids (TRIAMINO) TABS Take 2 tablets by mouth every evening.   budesonide (PULMICORT) 180 MCG/ACT inhaler Inhale 2 puffs into the lungs 2 (two) times daily.   cariprazine (VRAYLAR) 1.5 MG  capsule Take 1 capsule (1.5 mg total) by mouth daily.   cetirizine (ZYRTEC) 10 MG tablet Take 10 mg by mouth daily as needed for allergies.   Cholecalciferol (VITAMIN D3) 10000 units capsule Take 10,000 Units by mouth every evening.   Dextrose-Fructose-Sod Citrate [phone] MG CHEW Chew 1-2 tablets by mouth daily as needed (nausea).   dicyclomine (BENTYL) 20 MG tablet Take 1 tablet (20 mg total) by mouth 3 (three) times daily before meals. PRN   FLUoxetine (PROZAC) 20 MG capsule Take 1 capsule (20 mg total) by mouth daily.   fluticasone (FLONASE) 50 MCG/ACT nasal spray Place 2 sprays into both nostrils daily as needed for allergies.    Hydrocortisone Acetate 1 % OINT Apply 1 application topically 4 (four) times daily as needed.   LORazepam (ATIVAN) 0.5 MG tablet Take 0.5 tablets (0.25 mg total) by mouth daily as needed for anxiety.   Magnesium 400 MG TABS Take 400 mg by mouth every evening.   Melatonin 5 MG TABS Take 5 mg by mouth at bedtime.   MUCINEX D 60-600 MG 12 hr tablet Take 1 tablet by mouth every 12 hours.   Multiple Vitamins-Minerals (AIRBORNE) CHEW Chew 1 tablet by mouth daily as needed (immune support).   OIL OF OREGANO PO Take 1 capsule by mouth daily as needed (immune support).   omeprazole (PRILOSEC) 20 MG capsule Take 1 capsule (20 mg total) by mouth daily. (Patient taking differently: Take 20 mg by mouth every evening.)   OVER THE COUNTER MEDICATION Take 1 mL by mouth daily as needed (mood / memory). Deprenyl otc supplement   tiZANidine (ZANAFLEX) 4 MG tablet Take 1 tablet by mouth every 8 hours as needed for muscle spasms.   [DISCONTINUED] FLUoxetine (PROZAC) 40 MG capsule Take 1 capsule by mouth daily. (Patient taking differently: Take 20 mg by mouth daily.)       02/28/2023    1:54 PM 01/19/2023    3:10 PM 12/29/2022    3:33 PM 10/29/2022    4:57 PM  GAD 7 : Generalized Anxiety Score  Nervous, Anxious, on Edge 2 2 2 2   Control/stop worrying 2 2 2 1   Worry too much -  different things 2 2 2  0  Trouble relaxing 0 2 2 0  Restless 1 2 2  0  Easily annoyed or irritable 1 3 3 1   Afraid - awful might happen 3 1 1  0  Total GAD 7 Score 11 14 14 4   Anxiety Difficulty Somewhat difficult Somewhat difficult Somewhat difficult Not difficult at all       02/28/2023    1:53 PM 01/19/2023    3:08 PM 12/29/2022    3:33 PM  Depression screen PHQ 2/9  Decreased Interest 1 2 3   Down, Depressed, Hopeless 1 1 3   PHQ - 2 Score 2 3 6   Altered sleeping 1 3 3   Tired, decreased energy 3 3 3  Change in appetite 0 2 3  Feeling bad or failure about yourself  0 1 1  Trouble concentrating 0 3 2  Moving slowly or fidgety/restless 1 3 2   Suicidal thoughts 0 0 0  PHQ-9 Score 7 18 20   Difficult doing work/chores Not difficult at all Somewhat difficult Very difficult    BP Readings from Last 3 Encounters:  02/28/23 108/64  01/19/23 110/62  12/29/22 120/70    Physical Exam Vitals and nursing note reviewed.  Constitutional:      General: She is not in acute distress.    Appearance: She is well-developed.  HENT:     Head: Normocephalic and atraumatic.  Cardiovascular:     Rate and Rhythm: Normal rate and regular rhythm.     Heart sounds: No murmur heard. Pulmonary:     Effort: Pulmonary effort is normal. No respiratory distress.     Breath sounds: No wheezing or rhonchi.  Musculoskeletal:     Right lower leg: No edema.     Left lower leg: No edema.  Skin:    General: Skin is warm and dry.     Findings: No rash.  Neurological:     Mental Status: She is alert and oriented to person, place, and time.  Psychiatric:        Mood and Affect: Mood normal.        Behavior: Behavior normal.     Wt Readings from Last 3 Encounters:  02/28/23 194 lb (88 kg)  01/19/23 190 lb (86.2 kg)  12/29/22 189 lb 9.6 oz (86 kg)    BP 108/64   Pulse 71   Ht 5\' 7"  (1.702 m)   Wt 194 lb (88 kg)   LMP 04/10/2008   SpO2 97%   BMI 30.38 kg/m   Assessment and Plan:  Problem List  Items Addressed This Visit     Depression, major, single episode, in partial remission (HCC) - Primary    Clinically stable on current regimen with improvement in symptoms, No SI or HI. Now on Vraylar with Prozac. Will send in Rx of Vraylar and do PA if needed. Send new Rx for Prozac 20 mg daily rather than Prozac 40 mg qod No change in management at this time.       Relevant Medications   cariprazine (VRAYLAR) 1.5 MG capsule   FLUoxetine (PROZAC) 20 MG capsule   Other Visit Diagnoses     Encounter for screening mammogram for breast cancer       Relevant Orders   MM 3D SCREENING MAMMOGRAM BILATERAL BREAST       No follow-ups on file.   Partially dictated using Dragon software, any errors are not intentional.  Reubin Milan, MD [facility] Medicine [place], [place]

## 2023-02-28 NOTE — Telephone Encounter (Signed)
Chart review completed for patient. Patient is due for screening mammogram. Mychart message sent to patient to inquire about scheduling mammogram.  Anneke Cundy, Population Health Specialist.  

## 2023-02-28 NOTE — Assessment & Plan Note (Addendum)
Clinically stable on current regimen with improvement in symptoms, No SI or HI. Now on Vraylar with Prozac. Will send in Rx of Vraylar and do PA if needed. Send new Rx for Prozac 20 mg daily rather than Prozac 40 mg qod No change in management at this time.

## 2023-03-12 ENCOUNTER — Other Ambulatory Visit: Payer: Self-pay | Admitting: Internal Medicine

## 2023-03-15 ENCOUNTER — Ambulatory Visit: Payer: Medicaid Other | Admitting: Internal Medicine

## 2023-05-02 ENCOUNTER — Ambulatory Visit
Admission: RE | Admit: 2023-05-02 | Discharge: 2023-05-02 | Disposition: A | Discharge status: Home / Self Care | Payer: Medicaid Other | Source: Ambulatory Visit | Attending: Internal Medicine | Admitting: Internal Medicine

## 2023-05-02 DIAGNOSIS — Z1231 Encounter for screening mammogram for malignant neoplasm of breast: Secondary | ICD-10-CM | POA: Insufficient documentation

## 2023-05-04 ENCOUNTER — Other Ambulatory Visit: Payer: Self-pay | Admitting: Internal Medicine

## 2023-05-04 DIAGNOSIS — F324 Major depressive disorder, single episode, in partial remission: Secondary | ICD-10-CM

## 2023-06-15 ENCOUNTER — Encounter: Payer: Self-pay | Admitting: Internal Medicine

## 2023-06-30 ENCOUNTER — Other Ambulatory Visit: Payer: Self-pay | Admitting: Internal Medicine

## 2023-06-30 DIAGNOSIS — F064 Anxiety disorder due to known physiological condition: Secondary | ICD-10-CM

## 2023-06-30 NOTE — Telephone Encounter (Signed)
Requested medications are due for refill today.  unsure  Requested medications are on the active medications list.  yes  Last refill. 03/21/2020 #30 0 rf  Future visit scheduled.   no  Notes to clinic.  Refill not delegated.    Requested Prescriptions  Pending Prescriptions Disp Refills   LORazepam (ATIVAN) 0.5 MG tablet [Pharmacy Med Name: Lorazepam 0.5mg  Tablet] 30 tablet     Sig: Take 1/2 tablet by mouth daily as needed for anxiety.     Not Delegated - Psychiatry: Anxiolytics/Hypnotics 2 Failed - 06/30/2023  2:05 PM      Failed - This refill cannot be delegated      Failed - Urine Drug Screen completed in last 360 days      Passed - Patient is not pregnant      Passed - Valid encounter within last 6 months    Recent Outpatient Visits           4 months ago Depression, major, single episode, in partial remission (HCC)   [place] [facility] at [facility], Nyoka Cowden, MD   5 months ago Depression, major, single episode, in partial remission [facility])   [place] [facility] at [facility], Nyoka Cowden, MD   6 months ago Depression, major, single episode, in partial remission [facility])   [place] [facility] at [facility], Nyoka Cowden, MD   8 months ago Acute non-recurrent maxillary sinusitis   [place] [facility] at [facility], Nyoka Cowden, MD   1 year ago Acute maxillary sinusitis, recurrence not specified   [facility] at [facility] In[facility], [facility]

## 2023-07-01 NOTE — Telephone Encounter (Signed)
Please review.  KP

## 2023-07-20 ENCOUNTER — Other Ambulatory Visit: Payer: Self-pay | Admitting: Internal Medicine

## 2023-07-20 DIAGNOSIS — F324 Major depressive disorder, single episode, in partial remission: Secondary | ICD-10-CM

## 2023-07-20 NOTE — Telephone Encounter (Signed)
Requested medication (s) are due for refill today: Yes  Requested medication (s) are on the active medication list: Yes  Last refill:  05/04/23  Future visit scheduled: No  Notes to clinic:  Manual review    Requested Prescriptions  Pending Prescriptions Disp Refills   VRAYLAR 1.5 MG capsule [Pharmacy Med Name: Vraylar 1.5mg  Capsule] 90 capsule 0    Sig: Take 1 capsule by mouth daily.     Off-Protocol Failed - 07/20/2023  1:24 PM      Failed - Medication not assigned to a protocol, review manually.      Passed - Valid encounter within last 12 months    Recent Outpatient Visits           4 months ago Depression, major, single episode, in partial remission (HCC)   [place] [facility] at [facility], Nyoka Cowden, MD   6 months ago Depression, major, single episode, in partial remission [facility])   [place] [facility] at [facility], Nyoka Cowden, MD   6 months ago Depression, major, single episode, in partial remission [facility])   [place] [facility] Medicine at [facility], Nyoka Cowden, MD   8 months ago Acute non-recurrent maxillary sinusitis   [place] [facility] at [facility], Nyoka Cowden, MD   1 year ago Acute maxillary sinusitis, recurrence not specified   [facility] at [facility], MD

## 2023-09-04 ENCOUNTER — Other Ambulatory Visit: Payer: Self-pay | Admitting: Internal Medicine

## 2023-09-06 NOTE — Telephone Encounter (Signed)
 Courtesy refill. Patient will need an office visit for further refills. Requested Prescriptions  Pending Prescriptions Disp Refills   FLUoxetine  (PROZAC ) 40 MG capsule [Pharmacy Med Name: Fluoxetine  Hydrochloride 40mg  Capsule] 30 capsule 0    Sig: Take 1 capsule by mouth daily. This replaces the 20 mg dose.     Psychiatry:  Antidepressants - SSRI Failed - 09/06/2023 12:51 PM      Failed - Valid encounter within last 6 months    Recent Outpatient Visits           6 months ago Depression, major, single episode, in partial remission (HCC)   [place] [facility] at [facility], Leita DEL, MD   7 months ago Depression, major, single episode, in partial remission [facility])   [place] [facility] Medicine at [facility], Leita DEL, MD   8 months ago Depression, major, single episode, in partial remission [facility])   [place] [facility] Medicine at [facility], Leita DEL, MD   10 months ago Acute non-recurrent maxillary sinusitis   [place] [facility] at [facility], Leita DEL, MD   1 year ago Acute maxillary sinusitis, recurrence not specified   [place] [facility] at [facility] Cathryne JAYSON, MD              Passed - Completed PHQ-2 or PHQ-9 in the last 360 days

## 2023-09-06 NOTE — Telephone Encounter (Signed)
 Called pt - left message with person for pt to return call and schedule OV.

## 2023-11-23 ENCOUNTER — Other Ambulatory Visit: Payer: Self-pay

## 2023-11-24 NOTE — Progress Notes (Signed)
ERROR

## 2023-11-29 ENCOUNTER — Other Ambulatory Visit: Payer: Self-pay | Admitting: Internal Medicine

## 2023-11-30 NOTE — Telephone Encounter (Signed)
 OV 02/28/23, Patient has already had courtesy refill Requested Prescriptions  Pending Prescriptions Disp Refills   FLUoxetine (PROZAC) 40 MG capsule [Pharmacy Med Name: Fluoxetine Hydrochloride 40mg  Capsule] 30 capsule 0    Sig: Take 1 capsule by mouth daily. This replaces the 20 mg dose.     Psychiatry:  Antidepressants - SSRI Failed - 11/30/2023  2:40 PM      Failed - Valid encounter within last 6 months    Recent Outpatient Visits   None            Passed - Completed PHQ-2 or PHQ-9 in the last 360 days

## 2024-03-10 ENCOUNTER — Other Ambulatory Visit: Payer: Self-pay | Admitting: Internal Medicine

## 2024-03-12 NOTE — Telephone Encounter (Signed)
 Requested medications are due for refill today.  yes  Requested medications are on the active medications list.  yes  Last refill. 09/06/2023 #30 0 rf  Future visit scheduled.   no  Notes to clinic.  Pt last seen 02/28/2023. Courtesy refill already given.    Requested Prescriptions  Pending Prescriptions Disp Refills   FLUoxetine  (PROZAC ) 40 MG capsule [Pharmacy Med Name: Fluoxetine  Hydrochloride 40mg  Capsule] 30 capsule 0    Sig: Take 1 capsule by mouth daily. This replaces the 20 mg dose.     Psychiatry:  Antidepressants - SSRI Failed - 03/12/2024  4:58 PM      Failed - Completed PHQ-2 or PHQ-9 in the last 360 days      Failed - Valid encounter within last 6 months    Recent Outpatient Visits   None

## 2024-03-19 ENCOUNTER — Other Ambulatory Visit: Payer: Self-pay | Admitting: Internal Medicine

## 2024-03-20 NOTE — Telephone Encounter (Signed)
 Requested medications are due for refill today.  no  Requested medications are on the active medications list.  yes  Last refill. 03/13/2024 #30 0 rf  Future visit scheduled.   no  Notes to clinic.  Pt last seen 02/28/2023. Last refill on 03/13/2024 was a courtesy refill.    Requested Prescriptions  Pending Prescriptions Disp Refills   FLUoxetine  (PROZAC ) 40 MG capsule [Pharmacy Med Name: Fluoxetine  Hydrochloride 40mg  Capsule] 30 capsule 0    Sig: Take 1 capsule by mouth daily. This replaces the 20 mg dose. Needs appointment.     Psychiatry:  Antidepressants - SSRI Failed - 03/20/2024  4:40 PM      Failed - Completed PHQ-2 or PHQ-9 in the last 360 days      Failed - Valid encounter within last 6 months    Recent Outpatient Visits   None

## 2024-04-13 ENCOUNTER — Other Ambulatory Visit: Payer: Self-pay | Admitting: Internal Medicine

## 2024-04-13 ENCOUNTER — Encounter: Payer: Self-pay | Admitting: Internal Medicine

## 2024-04-13 ENCOUNTER — Ambulatory Visit (INDEPENDENT_AMBULATORY_CARE_PROVIDER_SITE_OTHER): Admitting: Internal Medicine

## 2024-04-13 VITALS — BP 100/70 | HR 90 | Ht 67.0 in | Wt 199.6 lb

## 2024-04-13 DIAGNOSIS — J3089 Other allergic rhinitis: Secondary | ICD-10-CM | POA: Diagnosis not present

## 2024-04-13 DIAGNOSIS — F324 Major depressive disorder, single episode, in partial remission: Secondary | ICD-10-CM | POA: Diagnosis not present

## 2024-04-13 DIAGNOSIS — Z90710 Acquired absence of both cervix and uterus: Secondary | ICD-10-CM

## 2024-04-13 DIAGNOSIS — J4520 Mild intermittent asthma, uncomplicated: Secondary | ICD-10-CM | POA: Diagnosis not present

## 2024-04-13 DIAGNOSIS — M62838 Other muscle spasm: Secondary | ICD-10-CM | POA: Diagnosis not present

## 2024-04-13 DIAGNOSIS — Z1231 Encounter for screening mammogram for malignant neoplasm of breast: Secondary | ICD-10-CM | POA: Diagnosis not present

## 2024-04-13 DIAGNOSIS — F064 Anxiety disorder due to known physiological condition: Secondary | ICD-10-CM

## 2024-04-13 DIAGNOSIS — E782 Mixed hyperlipidemia: Secondary | ICD-10-CM | POA: Diagnosis not present

## 2024-04-13 DIAGNOSIS — K219 Gastro-esophageal reflux disease without esophagitis: Secondary | ICD-10-CM

## 2024-04-13 MED ORDER — PSEUDOEPHEDRINE-GUAIFENESIN ER 60-600 MG PO TB12: 1.0000 | ORAL_TABLET | Freq: Two times a day (BID) | ORAL | 0 refills | Status: AC

## 2024-04-13 MED ORDER — HYDROCORTISONE ACETATE 1 % EX OINT: 1.0000 | TOPICAL_OINTMENT | Freq: Four times a day (QID) | CUTANEOUS | 2 refills | Status: AC | PRN

## 2024-04-13 MED ORDER — LORAZEPAM 0.5 MG PO TABS
0.2500 mg | ORAL_TABLET | Freq: Every day | ORAL | 0 refills | Status: AC | PRN
Start: 2024-04-13 — End: ?

## 2024-04-13 MED ORDER — PROGESTERONE 200 MG PO CAPS: 200.0000 mg | ORAL_CAPSULE | Freq: Every day | ORAL | 0 refills | Status: DC

## 2024-04-13 MED ORDER — FLUOXETINE HCL 40 MG PO CAPS: 40.0000 mg | ORAL_CAPSULE | Freq: Every day | ORAL | 0 refills | Status: DC

## 2024-04-13 MED ORDER — BUDESONIDE 180 MCG/ACT IN AEPB: 2.0000 | INHALATION_SPRAY | Freq: Two times a day (BID) | RESPIRATORY_TRACT | 0 refills | Status: AC

## 2024-04-13 MED ORDER — TIZANIDINE HCL 4 MG PO TABS: 4.0000 mg | ORAL_TABLET | Freq: Three times a day (TID) | ORAL | 0 refills | Status: AC | PRN

## 2024-04-13 MED ORDER — ERYTHROMYCIN 5 MG/GM OP OINT
1.0000 | TOPICAL_OINTMENT | Freq: Every day | OPHTHALMIC | 0 refills | Status: AC
Start: 2024-04-13 — End: ?

## 2024-04-13 MED ORDER — DICYCLOMINE HCL 20 MG PO TABS: 20.0000 mg | ORAL_TABLET | Freq: Three times a day (TID) | ORAL | 0 refills | Status: DC

## 2024-04-13 NOTE — Patient Instructions (Addendum)
 Dr. Susan Ziglar - [facility] in [facility]  Call [facility] to schedule your mammogram at [phone].

## 2024-04-13 NOTE — Assessment & Plan Note (Signed)
 Reflux symptoms are controlled on antacids and prn bentyl . Patient denies red flag symptoms - no melena, weight loss, dysphagia.

## 2024-04-13 NOTE — Assessment & Plan Note (Signed)
 Borderline elevation in the past but low 10 yr risk Will get labs

## 2024-04-13 NOTE — Progress Notes (Signed)
 Date:  04/13/2024   Name:  Casey Marquez   DOB:  28-Mar-1961   MRN:  [id number]   Chief Complaint: Medication Refill (Patient presents today for refills on all her prescription medication. She wold also like to discuss getting back progesterone , ointment for her eyes, and estrace, musinex D.)  Depression        This is a chronic problem.The problem is unchanged.  Associated symptoms include no fatigue, no headaches and no suicidal ideas.  Past treatments include SSRIs - Selective serotonin reuptake inhibitors.  Compliance with treatment is good. Back Pain This is a recurrent problem. The problem occurs intermittently. The pain is present in the lumbar spine. The quality of the pain is described as aching and cramping. Associated symptoms include abdominal pain. Pertinent negatives include no chest pain, dysuria, fever or headaches. She has tried muscle relaxant for the symptoms. The treatment provided significant relief.  Gastroesophageal Reflux She complains of abdominal pain, coughing, heartburn and wheezing. She reports no chest pain. This is a recurrent problem. The problem occurs occasionally. Pertinent negatives include no fatigue. Treatments tried: bentyl .  Asthma She complains of chest tightness, cough and wheezing. This is a recurrent problem. The problem occurs intermittently. Associated symptoms include heartburn. Pertinent negatives include no chest pain, fever or headaches. Her symptoms are alleviated by steroid inhaler. She reports significant improvement on treatment. Her past medical history is significant for asthma.  Menopausal sx - feelings of paranoid similar to her earlier PMS sx.  She has been on prometrium  daily for years since her hysterectomy but weaned off earlier this year.  Symptoms returned and she resumed it with benefit but does not have refills and no longer sees GYN.  She also uses estrace cream prn.   Review of Systems  Constitutional:  Negative for chills,  fatigue and fever.  HENT:  Negative for sinus pressure.   Eyes:  Positive for discharge. Negative for visual disturbance.  Respiratory:  Positive for cough, chest tightness and wheezing.   Cardiovascular:  Negative for chest pain, palpitations and leg swelling.  Gastrointestinal:  Positive for abdominal pain and heartburn.  Genitourinary:  Negative for dysuria and hematuria.  Musculoskeletal:  Positive for back pain.  Allergic/Immunologic: Positive for environmental allergies.  Neurological:  Negative for dizziness and headaches.  Psychiatric/Behavioral:  Positive for depression, dysphoric mood and sleep disturbance. Negative for suicidal ideas. The patient is nervous/anxious.      Lab Results  Component Value Date   NA 144 09/01/2021   K 4.4 09/01/2021   CO2 25 09/01/2021   GLUCOSE 104 (H) 09/01/2021   BUN 22 09/01/2021   CREATININE 0.88 09/01/2021   CALCIUM 9.7 09/01/2021   EGFR 76 09/01/2021   GFRNONAA >60 04/11/2018   Lab Results  Component Value Date   CHOL 244 (H) 09/01/2021   HDL 49 09/01/2021   LDLCALC 175 (H) 09/01/2021   TRIG 113 09/01/2021   CHOLHDL 5.0 (H) 09/01/2021   Lab Results  Component Value Date   TSH 3.490 09/01/2021   Lab Results  Component Value Date   HGBA1C 5.6 09/01/2021   Lab Results  Component Value Date   WBC 8.5 09/01/2021   HGB 14.1 09/01/2021   HCT 41.6 09/01/2021   MCV 87 09/01/2021   PLT 352 09/01/2021   Lab Results  Component Value Date   ALT 6 09/01/2021   AST 20 09/01/2021   ALKPHOS 98 09/01/2021   BILITOT 0.3 09/01/2021   No results found for: 25OHVITD2,  25OHVITD3, VD25OH   Patient Active Problem List   Diagnosis Date Noted   Status post total abdominal hysterectomy 11/25/2021   Mixed hyperlipidemia 09/02/2021   Anxiety disorder due to known physiological condition 03/21/2020   Mild intermittent asthma without complication 03/21/2020   Gastroesophageal reflux disease 06/28/2017   Environmental and seasonal  allergies 01/11/2015   Depression, major, single episode, in partial remission (HCC) 01/11/2015   Hot flash, menopausal 01/11/2015   Nerve pain 01/11/2015    Allergies  Allergen Reactions   Tegretol  [Carbamazepine]     confusion   Other Nausea Only    ALL OPIODS MAKE PT NAUSEATED   Sertraline Nausea Only   Tape Rash    Past Surgical History:  Procedure Laterality Date   EXCISIONAL HEMORRHOIDECTOMY  2002   fibroidectomy  1990   GANGLION CYST EXCISION  2007   INGUINAL HERNIA REPAIR Left 2015   LAPAROSCOPIC BILATERAL SALPINGO OOPHERECTOMY Bilateral 04/17/2018   Procedure: LAPAROSCOPIC BILATERAL SALPINGO OOPHORECTOMY;  Surgeon: Ward, Mitzie BROCKS, MD;  Location: [facility] ORS;  Service: Gynecology;  Laterality: Bilateral;   LAPAROSCOPIC HYSTERECTOMY N/A 04/17/2018   Procedure: HYSTERECTOMY TOTAL LAPAROSCOPIC;  Surgeon: Ward, Mitzie BROCKS, MD;  Location: [facility] ORS;  Service: Gynecology;  Laterality: N/A;   TENDON REPAIR Right    TOTAL ABDOMINAL HYSTERECTOMY  2019    Social History   Tobacco Use   Smoking status: Never   Smokeless tobacco: Never  Vaping Use   Vaping status: Never Used  Substance Use Topics   Alcohol use: Yes    Alcohol/week: 0.0 standard drinks of alcohol    Comment: EVERY OTHER DAY   Drug use: No     Medication list has been reviewed and updated.  Current Meds  Medication Sig   Amino Acids (TRIAMINO) TABS Take 2 tablets by mouth every evening.   cetirizine (ZYRTEC) 10 MG tablet Take 10 mg by mouth daily as needed for allergies.   Cholecalciferol (VITAMIN D3) 10000 units capsule Take 10,000 Units by mouth every evening.   Dextrose -Fructose-Sod Citrate [id number] MG CHEW Chew 1-2 tablets by mouth daily as needed (nausea).   fluticasone (FLONASE) 50 MCG/ACT nasal spray Place 2 sprays into both nostrils daily as needed for allergies.    Magnesium 400 MG TABS Take 400 mg by mouth every evening.   Melatonin 5 MG TABS Take 5 mg by mouth at bedtime.   Multiple  Vitamins-Minerals (AIRBORNE) CHEW Chew 1 tablet by mouth daily as needed (immune support).   OIL OF OREGANO PO Take 1 capsule by mouth daily as needed (immune support).   omeprazole  (PRILOSEC) 20 MG capsule Take 1 capsule (20 mg total) by mouth daily. (Patient taking differently: Take 20 mg by mouth every evening.)   OVER THE COUNTER MEDICATION Take 1 mL by mouth daily as needed (mood / memory). Deprenyl otc supplement   progesterone  (PROMETRIUM ) 200 MG capsule Take 1 capsule (200 mg total) by mouth daily.   [DISCONTINUED] budesonide  (PULMICORT ) 180 MCG/ACT inhaler Inhale 2 puffs into the lungs 2 (two) times daily.   [DISCONTINUED] dicyclomine  (BENTYL ) 20 MG tablet Take 1 tablet (20 mg total) by mouth 3 (three) times daily before meals. PRN   [DISCONTINUED] FLUoxetine  (PROZAC ) 40 MG capsule Take 1 capsule by mouth daily. This replaces the 20 mg dose.   [DISCONTINUED] LORazepam  (ATIVAN ) 0.5 MG tablet Take 0.5 tablets (0.25 mg total) by mouth daily as needed for anxiety.   [DISCONTINUED] tiZANidine  (ZANAFLEX ) 4 MG tablet Take 1 tablet by mouth every 8  hours as needed for muscle spasms.       04/13/2024    3:10 PM 02/28/2023    1:54 PM 01/19/2023    3:10 PM 12/29/2022    3:33 PM  GAD 7 : Generalized Anxiety Score  Nervous, Anxious, on Edge 2 2 2 2   Control/stop worrying 2 2 2 2   Worry too much - different things 2 2 2 2   Trouble relaxing 1 0 2 2  Restless 1 1 2 2   Easily annoyed or irritable 1 1 3 3   Afraid - awful might happen 3 3 1 1   Total GAD 7 Score 12 11 14 14   Anxiety Difficulty Somewhat difficult Somewhat difficult Somewhat difficult Somewhat difficult       04/13/2024    3:09 PM 02/28/2023    1:53 PM 01/19/2023    3:08 PM  Depression screen PHQ 2/9  Decreased Interest 2 1 2   Down, Depressed, Hopeless 1 1 1   PHQ - 2 Score 3 2 3   Altered sleeping 3 1 3   Tired, decreased energy 3 3 3   Change in appetite 2 0 2  Feeling bad or failure about yourself  1 0 1  Trouble concentrating 1 0  3  Moving slowly or fidgety/restless 0 1 3  Suicidal thoughts 0 0 0  PHQ-9 Score 13 7 18   Difficult doing work/chores Somewhat difficult Not difficult at all Somewhat difficult    BP Readings from Last 3 Encounters:  04/13/24 100/70  02/28/23 108/64  01/19/23 110/62    Physical Exam Vitals and nursing note reviewed.  Constitutional:      General: She is not in acute distress.    Appearance: Normal appearance. She is well-developed.  HENT:     Head: Normocephalic and atraumatic.  Neck:     Vascular: No carotid bruit.  Cardiovascular:     Rate and Rhythm: Normal rate and regular rhythm.     Heart sounds: No murmur heard. Pulmonary:     Effort: Pulmonary effort is normal. No respiratory distress.     Breath sounds: No wheezing or rhonchi.  Musculoskeletal:     Cervical back: Normal range of motion.     Right lower leg: No edema.     Left lower leg: No edema.  Lymphadenopathy:     Cervical: No cervical adenopathy.  Skin:    General: Skin is warm and dry.     Capillary Refill: Capillary refill takes less than 2 seconds.     Findings: No rash.  Neurological:     General: No focal deficit present.     Mental Status: She is alert and oriented to person, place, and time.  Psychiatric:        Mood and Affect: Mood normal.        Behavior: Behavior normal.     Wt Readings from Last 3 Encounters:  04/13/24 199 lb 9.6 oz (90.5 kg)  02/28/23 194 lb (88 kg)  01/19/23 190 lb (86.2 kg)    BP 100/70   Pulse 90   Ht 5' 7 (1.702 m)   Wt 199 lb 9.6 oz (90.5 kg)   LMP 04/10/2008   SpO2 98%   BMI 31.26 kg/m   Assessment and Plan:  Problem List Items Addressed This Visit       Unprioritized   Environmental and seasonal allergies   Uses Mucinex -D prn for cough and congestion and erythromycin  eye ointment for eye inflammation. Requests refills.      Relevant Medications  erythromycin  ophthalmic ointment   Depression, major, single episode, in partial remission (HCC)  - Primary   Clinically stable on Prozac  40 mg.   No SI or HI on evaluation. Plan to continue same medications for now.       Relevant Medications   FLUoxetine  (PROZAC ) 40 MG capsule   LORazepam  (ATIVAN ) 0.5 MG tablet   Other Relevant Orders   TSH   Gastroesophageal reflux disease   Reflux symptoms are controlled on antacids and prn bentyl . Patient denies red flag symptoms - no melena, weight loss, dysphagia.       Relevant Medications   dicyclomine  (BENTYL ) 20 MG tablet   Anxiety disorder due to known physiological condition   On Prozac  daily with improvement. Also uses Lorazepam  PRN - refill sent.      Relevant Medications   FLUoxetine  (PROZAC ) 40 MG capsule   LORazepam  (ATIVAN ) 0.5 MG tablet   Mild intermittent asthma without complication   Asthma controlled on Pulmicort .      Relevant Medications   budesonide  (PULMICORT ) 180 MCG/ACT inhaler   pseudoephedrine -guaifenesin  (MUCINEX  D) 60-600 MG 12 hr tablet   Other Relevant Orders   CBC with Differential/Platelet   Comprehensive metabolic panel with GFR   Mixed hyperlipidemia   Borderline elevation in the past but low 10 yr risk Will get labs      Relevant Orders   Lipid panel   Status post total abdominal hysterectomy   Requests refill on prometrium  for symptoms Refill sent.      Relevant Medications   progesterone  (PROMETRIUM ) 200 MG capsule   Other Visit Diagnoses       Muscle spasm       Relevant Medications   tiZANidine  (ZANAFLEX ) 4 MG tablet     Encounter for screening mammogram for breast cancer       schedule Mammogram   Relevant Orders   MM 3D SCREENING MAMMOGRAM BILATERAL BREAST       No follow-ups on file.    Leita HILARIO Adie, MD [facility] [place]

## 2024-04-13 NOTE — Assessment & Plan Note (Signed)
 On Prozac  daily with improvement. Also uses Lorazepam  PRN - refill sent.

## 2024-04-13 NOTE — Assessment & Plan Note (Signed)
 Clinically stable on Prozac  40 mg.   No SI or HI on evaluation. Plan to continue same medications for now.

## 2024-04-13 NOTE — Progress Notes (Unsigned)
 Date:  04/13/2024   Name:  Casey Marquez   DOB:  [birth year]   MRN:  [id number]   Chief Complaint: No chief complaint on file.  HPI  Review of Systems   Lab Results  Component Value Date   NA 144 09/01/2021   K 4.4 09/01/2021   CO2 25 09/01/2021   GLUCOSE 104 (H) 09/01/2021   BUN 22 09/01/2021   CREATININE 0.88 09/01/2021   CALCIUM 9.7 09/01/2021   EGFR 76 09/01/2021   GFRNONAA >60 04/11/2018   Lab Results  Component Value Date   CHOL 244 (H) 09/01/2021   HDL 49 09/01/2021   LDLCALC 175 (H) 09/01/2021   TRIG 113 09/01/2021   CHOLHDL 5.0 (H) 09/01/2021   Lab Results  Component Value Date   TSH 3.490 09/01/2021   Lab Results  Component Value Date   HGBA1C 5.6 09/01/2021   Lab Results  Component Value Date   WBC 8.5 09/01/2021   HGB 14.1 09/01/2021   HCT 41.6 09/01/2021   MCV 87 09/01/2021   PLT 352 09/01/2021   Lab Results  Component Value Date   ALT 6 09/01/2021   AST 20 09/01/2021   ALKPHOS 98 09/01/2021   BILITOT 0.3 09/01/2021   No results found for: MARIEN BOLLS, VD25OH   Patient Active Problem List   Diagnosis Date Noted   Status post total abdominal hysterectomy 11/25/2021   Mixed hyperlipidemia 09/02/2021   Anxiety disorder due to known physiological condition 03/21/2020   Mild intermittent asthma without complication 03/21/2020   Gastroesophageal reflux disease 06/28/2017   Environmental and seasonal allergies 01/11/2015   Depression, major, single episode, in partial remission (HCC) 01/11/2015   Hot flash, menopausal 01/11/2015   Nerve pain 01/11/2015    Allergies  Allergen Reactions   Tegretol  [Carbamazepine]     confusion   Other Nausea Only    ALL OPIODS MAKE PT NAUSEATED   Sertraline Nausea Only   Tape Rash    Past Surgical History:  Procedure Laterality Date   EXCISIONAL HEMORRHOIDECTOMY  2002   fibroidectomy  1990   GANGLION CYST EXCISION  2007   INGUINAL HERNIA REPAIR Left 2015   LAPAROSCOPIC  BILATERAL SALPINGO OOPHERECTOMY Bilateral 04/17/2018   Procedure: LAPAROSCOPIC BILATERAL SALPINGO OOPHORECTOMY;  Surgeon: Ward, Mitzie BROCKS, MD;  Location: [facility] ORS;  Service: Gynecology;  Laterality: Bilateral;   LAPAROSCOPIC HYSTERECTOMY N/A 04/17/2018   Procedure: HYSTERECTOMY TOTAL LAPAROSCOPIC;  Surgeon: Ward, Mitzie BROCKS, MD;  Location: [facility] ORS;  Service: Gynecology;  Laterality: N/A;   TENDON REPAIR Right    TOTAL ABDOMINAL HYSTERECTOMY      Social History   Tobacco Use   Smoking status: Never   Smokeless tobacco: Never  Vaping Use   Vaping status: Never Used  Substance Use Topics   Alcohol use: Yes    Alcohol/week: 0.0 standard drinks of alcohol    Comment: EVERY OTHER DAY   Drug use: No     Medication list has been reviewed and updated.  No outpatient medications have been marked as taking for the 04/13/24 encounter (Orders Only) with Casey Casey DEL, MD.       02/28/2023    1:54 PM 01/19/2023    3:10 PM 12/29/2022    3:33 PM 10/29/2022    4:57 PM  GAD 7 : Generalized Anxiety Score  Nervous, Anxious, on Edge 2 2 2 2   Control/stop worrying 2 2 2 1   Worry too much - different things 2 2 2  0  Trouble relaxing 0 2 2 0  Restless 1 2 2  0  Easily annoyed or irritable 1 3 3 1   Afraid - awful might happen 3 1 1  0  Total GAD 7 Score 11 14 14 4   Anxiety Difficulty Somewhat difficult Somewhat difficult Somewhat difficult Not difficult at all       02/28/2023    1:53 PM 01/19/2023    3:08 PM 12/29/2022    3:33 PM  Depression screen PHQ 2/9  Decreased Interest 1 2 3   Down, Depressed, Hopeless 1 1 3   PHQ - 2 Score 2 3 6   Altered sleeping 1 3 3   Tired, decreased energy 3 3 3   Change in appetite 0 2 3  Feeling bad or failure about yourself  0 1 1  Trouble concentrating 0 3 2  Moving slowly or fidgety/restless 1 3 2   Suicidal thoughts 0 0 0  PHQ-9 Score 7 18 20   Difficult doing work/chores Not difficult at all Somewhat difficult Very difficult    BP Readings from Last 3  Encounters:  02/28/23 108/64  01/19/23 110/62  12/29/22 120/70    Physical Exam  Wt Readings from Last 3 Encounters:  02/28/23 194 lb (88 kg)  01/19/23 190 lb (86.2 kg)  12/29/22 189 lb 9.6 oz (86 kg)    LMP 04/10/2008   Assessment and Plan:  Problem List Items Addressed This Visit   None   No follow-ups on file.    Casey HILARIO Adie, MD Casey Marquez

## 2024-04-13 NOTE — Assessment & Plan Note (Signed)
 Asthma controlled on Pulmicort .

## 2024-04-13 NOTE — Assessment & Plan Note (Addendum)
 Uses Mucinex -D prn for cough and congestion and erythromycin  eye ointment for eye inflammation. Requests refills.

## 2024-04-13 NOTE — Assessment & Plan Note (Signed)
 Requests refill on prometrium  for symptoms Refill sent.

## 2024-04-14 ENCOUNTER — Ambulatory Visit: Payer: Self-pay | Admitting: Internal Medicine

## 2024-04-14 DIAGNOSIS — E782 Mixed hyperlipidemia: Secondary | ICD-10-CM

## 2024-04-14 LAB — CBC WITH DIFFERENTIAL/PLATELET
Basophils Absolute: 0.1 x10E3/uL (ref 0.0–0.2)
Basos: 1 %
EOS (ABSOLUTE): 0.2 x10E3/uL (ref 0.0–0.4)
Eos: 3 %
Hematocrit: 45.1 % (ref 34.0–46.6)
Hemoglobin: 14.4 g/dL (ref 11.1–15.9)
Immature Grans (Abs): 0 x10E3/uL (ref 0.0–0.1)
Immature Granulocytes: 0 %
Lymphocytes Absolute: 2.9 x10E3/uL (ref 0.7–3.1)
Lymphs: 42 %
MCH: 28.3 pg (ref 26.6–33.0)
MCHC: 31.9 g/dL (ref 31.5–35.7)
MCV: 89 fL (ref 79–97)
Monocytes Absolute: 0.4 x10E3/uL (ref 0.1–0.9)
Monocytes: 6 %
Neutrophils Absolute: 3.2 x10E3/uL (ref 1.4–7.0)
Neutrophils: 48 %
Platelets: 337 x10E3/uL (ref 150–450)
RBC: 5.08 x10E6/uL (ref 3.77–5.28)
RDW: 13.4 % (ref 11.7–15.4)
WBC: 6.8 x10E3/uL (ref 3.4–10.8)

## 2024-04-14 LAB — LIPID PANEL
Chol/HDL Ratio: 5.4 ratio — ABNORMAL HIGH (ref 0.0–4.4)
Cholesterol, Total: 263 mg/dL — ABNORMAL HIGH (ref 100–199)
HDL: 49 mg/dL (ref >39)
LDL Chol Calc (NIH): 185 mg/dL — ABNORMAL HIGH (ref 0–99)
Triglycerides: 159 mg/dL — ABNORMAL HIGH (ref 0–149)
VLDL Cholesterol Cal: 29 mg/dL (ref 5–40)

## 2024-04-14 LAB — COMPREHENSIVE METABOLIC PANEL WITH GFR
ALT: 7 IU/L (ref 0–32)
AST: 20 IU/L (ref 0–40)
Albumin: 4.7 g/dL (ref 3.9–4.9)
Alkaline Phosphatase: 92 IU/L (ref 44–121)
BUN/Creatinine Ratio: 27 (ref 12–28)
BUN: 25 mg/dL (ref 8–27)
Bilirubin Total: 0.3 mg/dL (ref 0.0–1.2)
CO2: 23 mmol/L (ref 20–29)
Calcium: 9.7 mg/dL (ref 8.7–10.3)
Chloride: 105 mmol/L (ref 96–106)
Creatinine, Ser: 0.91 mg/dL (ref 0.57–1.00)
Globulin, Total: 2.9 g/dL (ref 1.5–4.5)
Glucose: 100 mg/dL — ABNORMAL HIGH (ref 70–99)
Potassium: 4.4 mmol/L (ref 3.5–5.2)
Sodium: 142 mmol/L (ref 134–144)
Total Protein: 7.6 g/dL (ref 6.0–8.5)
eGFR: 71 mL/min/1.73 (ref >59)

## 2024-04-14 LAB — TSH: TSH: 2.5 u[IU]/mL (ref 0.450–4.500)

## 2024-04-25 ENCOUNTER — Other Ambulatory Visit: Payer: Self-pay | Admitting: Internal Medicine

## 2024-04-25 DIAGNOSIS — K219 Gastro-esophageal reflux disease without esophagitis: Secondary | ICD-10-CM

## 2024-04-25 DIAGNOSIS — M62838 Other muscle spasm: Secondary | ICD-10-CM

## 2024-04-26 ENCOUNTER — Other Ambulatory Visit: Payer: Self-pay | Admitting: Internal Medicine

## 2024-04-26 DIAGNOSIS — J3089 Other allergic rhinitis: Secondary | ICD-10-CM

## 2024-04-26 NOTE — Telephone Encounter (Signed)
 Requested Prescriptions  Refused Prescriptions Disp Refills   erythromycin  ophthalmic ointment      Sig: Apply 1 application topically into both eyes at bedtime.     Off-Protocol Failed - 04/26/2024  3:50 PM      Failed - Medication not assigned to a protocol, review manually.      Passed - Valid encounter within last 12 months    Recent Outpatient Visits           1 week ago Depression, major, single episode, in partial remission [facility])   [place] [facility] at [facility], Leita DEL, MD

## 2024-04-26 NOTE — Telephone Encounter (Signed)
 Requested medication (s) are due for refill today - no  Requested medication (s) are on the active medication list -yes  Future visit scheduled -no  Last refill: 04/13/24 #90  Notes to clinic: non delegated Rx  Requested Prescriptions  Pending Prescriptions Disp Refills   tiZANidine  (ZANAFLEX ) 4 MG tablet 90 tablet 0    Sig: Take 1 tablet by mouth every 8 hours as needed for muscle spasms.     Not Delegated - Cardiovascular:  Alpha-2 Agonists - tizanidine  Failed - 04/26/2024 11:47 AM      Failed - This refill cannot be delegated      Passed - Valid encounter within last 6 months    Recent Outpatient Visits           1 week ago Depression, major, single episode, in partial remission [facility])   [place] [facility] at [facility], Leita DEL, MD              Signed Prescriptions Disp Refills   dicyclomine  (BENTYL ) 20 MG tablet 90 tablet 0    Sig: Take 1 tablet by mouth three times daily before meals as needed.     Gastroenterology:  Antispasmodic Agents Passed - 04/26/2024 11:47 AM      Passed - Valid encounter within last 12 months    Recent Outpatient Visits           1 week ago Depression, major, single episode, in partial remission [facility])   [place] [facility] at [facility], Leita DEL, MD                 Requested Prescriptions  Pending Prescriptions Disp Refills   tiZANidine  (ZANAFLEX ) 4 MG tablet 90 tablet 0    Sig: Take 1 tablet by mouth every 8 hours as needed for muscle spasms.     Not Delegated - Cardiovascular:  Alpha-2 Agonists - tizanidine  Failed - 04/26/2024 11:47 AM      Failed - This refill cannot be delegated      Passed - Valid encounter within last 6 months    Recent Outpatient Visits           1 week ago Depression, major, single episode, in partial remission [facility])   [place] [facility] at [facility], Leita DEL, MD               Signed Prescriptions Disp Refills   dicyclomine  (BENTYL ) 20 MG tablet 90 tablet 0    Sig: Take 1 tablet by mouth three times daily before meals as needed.     Gastroenterology:  Antispasmodic Agents Passed - 04/26/2024 11:47 AM      Passed - Valid encounter within last 12 months    Recent Outpatient Visits           1 week ago Depression, major, single episode, in partial remission [facility])   [place] [facility] at [facility], Leita DEL, MD

## 2024-04-26 NOTE — Telephone Encounter (Signed)
 Rx 04/13/24 #90- mail order Rx- slightly over 2 weeks out- will RF Requested Prescriptions  Pending Prescriptions Disp Refills   tiZANidine  (ZANAFLEX ) 4 MG tablet 90 tablet 0    Sig: Take 1 tablet by mouth every 8 hours as needed for muscle spasms.     Not Delegated - Cardiovascular:  Alpha-2 Agonists - tizanidine  Failed - 04/26/2024 11:46 AM      Failed - This refill cannot be delegated      Passed - Valid encounter within last 6 months    Recent Outpatient Visits           1 week ago Depression, major, single episode, in partial remission [facility])   [place] [facility] at [facility], Leita DEL, MD               dicyclomine  (BENTYL ) 20 MG tablet 90 tablet 0    Sig: Take 1 tablet by mouth three times daily before meals as needed.     Gastroenterology:  Antispasmodic Agents Passed - 04/26/2024 11:46 AM      Passed - Valid encounter within last 12 months    Recent Outpatient Visits           1 week ago Depression, major, single episode, in partial remission [facility])   [place] [facility] at [facility], Leita DEL, MD

## 2024-06-24 ENCOUNTER — Other Ambulatory Visit: Payer: Self-pay | Admitting: Internal Medicine

## 2024-06-24 DIAGNOSIS — F064 Anxiety disorder due to known physiological condition: Secondary | ICD-10-CM

## 2024-06-24 DIAGNOSIS — Z90710 Acquired absence of both cervix and uterus: Secondary | ICD-10-CM

## 2024-06-24 DIAGNOSIS — F324 Major depressive disorder, single episode, in partial remission: Secondary | ICD-10-CM

## 2024-06-26 NOTE — Telephone Encounter (Signed)
 Requested Prescriptions  Pending Prescriptions Disp Refills   progesterone  (PROMETRIUM ) 200 MG capsule 90 capsule 0    Sig: Take 1 capsule by mouth daily.     OB/GYN:  Progestins Passed - 06/26/2024  1:07 PM      Passed - Last BP in normal range    BP Readings from Last 1 Encounters:  04/13/24 100/70         Passed - Valid encounter within last 12 months    Recent Outpatient Visits           2 months ago Depression, major, single episode, in partial remission   [place] [facility] at [facility], Leita DEL, MD              Passed - Patient is not a smoker       FLUoxetine  (PROZAC ) 40 MG capsule 90 capsule 0    Sig: Take 1 capsule by mouth daily. Please schedule appointment with provider.     Psychiatry:  Antidepressants - SSRI Passed - 06/26/2024  1:07 PM      Passed - Completed PHQ-2 or PHQ-9 in the last 360 days      Passed - Valid encounter within last 6 months    Recent Outpatient Visits           2 months ago Depression, major, single episode, in partial remission   [facility] at [facility], Leita DEL, MD

## 2024-07-06 ENCOUNTER — Telehealth: Payer: Self-pay | Admitting: *Deleted

## 2024-07-06 DIAGNOSIS — E782 Mixed hyperlipidemia: Secondary | ICD-10-CM

## 2024-07-06 NOTE — Progress Notes (Signed)
 Complex Care Management Note Care Guide Note  07/06/2024 Name: Casey Marquez MRN: [id number] DOB: Sep 17, 1960   Complex Care Management Outreach Attempts: An unsuccessful telephone outreach was attempted today to offer the patient information about available complex care management services.  Follow Up Plan:  Additional outreach attempts will be made to offer the patient complex care management information and services.   Encounter Outcome:  No Answer  Thedford Franks, CMA [place]  [facility] Guide Direct Dial: [phone]  Fax: [phone] Website: [email or web address]

## 2024-07-10 NOTE — Progress Notes (Unsigned)
 Complex Care Management Note Care Guide Note  07/10/2024 Name: Casey Marquez MRN: [id number] DOB: [birth year]   Complex Care Management Outreach Attempts: A second unsuccessful outreach was attempted today to offer the patient with information about available complex care management services.  Follow Up Plan:  Additional outreach attempts will be made to offer the patient complex care management information and services.   Encounter Outcome:  No Answer  Thedford Franks, CMA [place]  [facility], [facility] Guide Direct Dial: [phone]  Fax: [phone] Website: [email or web address]

## 2024-07-11 NOTE — Progress Notes (Signed)
 Complex Care Management Note  Care Guide Note 07/11/2024 Name: Casey Marquez MRN: [id number] DOB: 1960/10/15  Casey Marquez is a 63 y.o. year old female who sees Justus Leita DEL, MD for primary care. I reached out to Dorthy Pouch by phone today to offer complex care management services.  [name] was given information about Complex Care Management services today including:   The Complex Care Management services include support from the care team which includes your Nurse Care Manager, Clinical Social Worker, or Pharmacist.  The Complex Care Management team is here to help remove barriers to the health concerns and goals most important to you. Complex Care Management services are voluntary, and the patient may decline or stop services at any time by request to their care team member.   Complex Care Management Consent Status: Patient did not agree to participate in complex care management services at this time.  Encounter Outcome:  Patient Refused  Thedford Franks, CMA [place]  [facility], [facility] Guide Direct Dial: [phone]  Fax: [phone] Website: [email or web address]

## 2024-08-02 ENCOUNTER — Other Ambulatory Visit: Payer: Self-pay | Admitting: Internal Medicine

## 2024-08-02 DIAGNOSIS — K219 Gastro-esophageal reflux disease without esophagitis: Secondary | ICD-10-CM

## 2024-08-06 NOTE — Telephone Encounter (Signed)
 Requested Prescriptions  Pending Prescriptions Disp Refills   dicyclomine  (BENTYL ) 20 MG tablet 90 tablet 0    Sig: Take 1 tablet by mouth three times daily before meals as needed.     Gastroenterology:  Antispasmodic Agents Passed - 08/06/2024 11:20 AM      Passed - Valid encounter within last 12 months    Recent Outpatient Visits           3 months ago Depression, major, single episode, in partial remission   [facility], [facility] at [facility], Leita DEL, MD
